# Patient Record
Sex: Male | Born: 1970 | Race: White | Hispanic: No | Marital: Single | Smoking: Current every day smoker
Health system: Southern US, Community
[De-identification: ages and names within clinical notes are randomized; demographics above are authoritative.]

## PROBLEM LIST (undated history)

## (undated) HISTORY — PX: APPENDECTOMY: SHX54

---

## 1998-09-15 ENCOUNTER — Encounter: Admission: RE | Admit: 1998-09-15 | Discharge: 1998-09-15 | Payer: Self-pay | Admitting: Family Medicine

## 2012-08-27 ENCOUNTER — Emergency Department (HOSPITAL_COMMUNITY)
Admission: EM | Admit: 2012-08-27 | Discharge: 2012-08-28 | Disposition: A | Discharge status: Home / Self Care | Payer: Self-pay | Attending: Emergency Medicine | Admitting: Emergency Medicine

## 2012-08-27 ENCOUNTER — Encounter (HOSPITAL_COMMUNITY): Payer: Self-pay | Admitting: Emergency Medicine

## 2012-08-27 DIAGNOSIS — N200 Calculus of kidney: Secondary | ICD-10-CM | POA: Insufficient documentation

## 2012-08-27 LAB — CBC WITH DIFFERENTIAL/PLATELET
Eosinophils Absolute: 0.1 10*3/uL (ref 0.0–0.7)
Hemoglobin: 14.9 g/dL (ref 13.0–17.0)
Lymphocytes Relative: 23 % (ref 12–46)
Lymphs Abs: 2.1 10*3/uL (ref 0.7–4.0)
MCH: 30.5 pg (ref 26.0–34.0)
Monocytes Relative: 6 % (ref 3–12)
Neutro Abs: 6.3 10*3/uL (ref 1.7–7.7)
Neutrophils Relative %: 70 % (ref 43–77)
RBC: 4.88 MIL/uL (ref 4.22–5.81)
WBC: 9 10*3/uL (ref 4.0–10.5)

## 2012-08-27 LAB — URINALYSIS, ROUTINE W REFLEX MICROSCOPIC
Ketones, ur: 15 mg/dL — AB
Leukocytes, UA: NEGATIVE
Nitrite: NEGATIVE
Protein, ur: 30 mg/dL — AB

## 2012-08-27 LAB — COMPREHENSIVE METABOLIC PANEL
ALT: 29 U/L (ref 0–53)
Alkaline Phosphatase: 83 U/L (ref 39–117)
BUN: 16 mg/dL (ref 6–23)
CO2: 31 mEq/L (ref 19–32)
Chloride: 102 mEq/L (ref 96–112)
GFR calc Af Amer: 70 mL/min — ABNORMAL LOW (ref >90)
Glucose, Bld: 127 mg/dL — ABNORMAL HIGH (ref 70–99)
Potassium: 3.7 mEq/L (ref 3.5–5.1)
Total Bilirubin: 0.3 mg/dL (ref 0.3–1.2)

## 2012-08-27 LAB — URINE MICROSCOPIC-ADD ON

## 2012-08-27 NOTE — ED Notes (Signed)
PT. REPORTS LLQ PAIN RADIATING TO LEFT GROIN WITH VOMITTING , DENIES DIARRHEA OR DYSURIA . NO FEVER / SLIGHT CHILLS.

## 2012-08-28 ENCOUNTER — Emergency Department (HOSPITAL_COMMUNITY): Payer: Self-pay

## 2012-08-28 MED ORDER — SODIUM CHLORIDE 0.9 % IV SOLN
Freq: Once | INTRAVENOUS | Status: AC
  Administered 2012-08-28: 01:00:00 via INTRAVENOUS

## 2012-08-28 MED ORDER — PROMETHAZINE HCL 25 MG PO TABS: 12.5000 mg | ORAL_TABLET | Freq: Four times a day (QID) | ORAL | Status: DC | PRN

## 2012-08-28 MED ORDER — HYDROMORPHONE HCL PF 1 MG/ML IJ SOLN
1.0000 mg | Freq: Once | INTRAMUSCULAR | Status: AC
  Administered 2012-08-28: 1 mg via INTRAVENOUS
  Filled 2012-08-28: qty 1

## 2012-08-28 MED ORDER — HYDROCODONE-ACETAMINOPHEN 5-500 MG PO TABS: 1.0000 | ORAL_TABLET | Freq: Four times a day (QID) | ORAL | Status: DC | PRN

## 2012-08-28 MED ORDER — ONDANSETRON HCL 4 MG/2ML IJ SOLN
4.0000 mg | Freq: Once | INTRAMUSCULAR | Status: AC
  Administered 2012-08-28: 4 mg via INTRAVENOUS
  Filled 2012-08-28: qty 2
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
## 2012-08-28 NOTE — ED Provider Notes (Signed)
Medical screening examination/treatment/procedure(s) were performed by non-physician practitioner and as supervising physician I was immediately available for consultation/collaboration.  Sunnie Nielsen, MD 08/28/12 [phone]

## 2017-02-18 ENCOUNTER — Ambulatory Visit (HOSPITAL_COMMUNITY)
Admission: EM | Admit: 2017-02-18 | Discharge: 2017-02-19 | Disposition: A | Discharge status: Home / Self Care | Payer: Self-pay | Attending: Emergency Medicine | Admitting: Emergency Medicine

## 2017-02-18 ENCOUNTER — Emergency Department (HOSPITAL_COMMUNITY): Payer: Self-pay | Admitting: Certified Registered Nurse Anesthetist

## 2017-02-18 ENCOUNTER — Encounter (HOSPITAL_COMMUNITY)
Admission: EM | Disposition: A | Discharge status: Home / Self Care | Payer: Self-pay | Source: Home / Self Care | Attending: Emergency Medicine

## 2017-02-18 ENCOUNTER — Encounter (HOSPITAL_COMMUNITY): Payer: Self-pay

## 2017-02-18 ENCOUNTER — Emergency Department (HOSPITAL_COMMUNITY): Payer: Self-pay

## 2017-02-18 DIAGNOSIS — F17210 Nicotine dependence, cigarettes, uncomplicated: Secondary | ICD-10-CM | POA: Insufficient documentation

## 2017-02-18 DIAGNOSIS — K3580 Unspecified acute appendicitis: Secondary | ICD-10-CM | POA: Diagnosis present

## 2017-02-18 DIAGNOSIS — Z87442 Personal history of urinary calculi: Secondary | ICD-10-CM | POA: Insufficient documentation

## 2017-02-18 HISTORY — PX: LAPAROSCOPIC APPENDECTOMY: SHX408

## 2017-02-18 LAB — COMPREHENSIVE METABOLIC PANEL
ALT: 50 U/L (ref 17–63)
AST: 38 U/L (ref 15–41)
Albumin: 4 g/dL (ref 3.5–5.0)
Alkaline Phosphatase: 81 U/L (ref 38–126)
Anion gap: 9 (ref 5–15)
BUN: 11 mg/dL (ref 6–20)
CHLORIDE: 100 mmol/L — AB (ref 101–111)
CO2: 26 mmol/L (ref 22–32)
CREATININE: 1.1 mg/dL (ref 0.61–1.24)
Calcium: 9.1 mg/dL (ref 8.9–10.3)
GFR calc Af Amer: >60 mL/min (ref >60)
GFR calc non Af Amer: >60 mL/min (ref >60)
GLUCOSE: 122 mg/dL — AB (ref 65–99)
Potassium: 4 mmol/L (ref 3.5–5.1)
SODIUM: 135 mmol/L (ref 135–145)
Total Bilirubin: 0.7 mg/dL (ref 0.3–1.2)
Total Protein: 6.8 g/dL (ref 6.5–8.1)

## 2017-02-18 LAB — CBC
HCT: 44.4 % (ref 39.0–52.0)
Hemoglobin: 15 g/dL (ref 13.0–17.0)
MCH: 30.7 pg (ref 26.0–34.0)
MCHC: 33.8 g/dL (ref 30.0–36.0)
MCV: 91 fL (ref 78.0–100.0)
PLATELETS: 214 10*3/uL (ref 150–400)
RBC: 4.88 MIL/uL (ref 4.22–5.81)
RDW: 12.7 % (ref 11.5–15.5)
WBC: 10.8 10*3/uL — ABNORMAL HIGH (ref 4.0–10.5)

## 2017-02-18 LAB — URINALYSIS, ROUTINE W REFLEX MICROSCOPIC
Bilirubin Urine: NEGATIVE
Glucose, UA: NEGATIVE mg/dL
Hgb urine dipstick: NEGATIVE
KETONES UR: NEGATIVE mg/dL
Leukocytes, UA: NEGATIVE
Nitrite: NEGATIVE
PH: 8 (ref 5.0–8.0)
Protein, ur: 100 mg/dL — AB
Specific Gravity, Urine: 1.023 (ref 1.005–1.030)

## 2017-02-18 LAB — LIPASE, BLOOD: LIPASE: 17 U/L (ref 11–51)

## 2017-02-18 SURGERY — APPENDECTOMY, LAPAROSCOPIC
Anesthesia: General | Site: Abdomen

## 2017-02-18 MED ORDER — ONDANSETRON HCL 4 MG/2ML IJ SOLN: 4.0000 mg | Freq: Four times a day (QID) | INTRAMUSCULAR | Status: DC | PRN

## 2017-02-18 MED ORDER — ENOXAPARIN SODIUM 40 MG/0.4ML ~~LOC~~ SOLN
40.0000 mg | SUBCUTANEOUS | Status: DC
  Administered 2017-02-19: 40 mg via SUBCUTANEOUS
  Filled 2017-02-18: qty 0.4

## 2017-02-18 MED ORDER — METHOCARBAMOL 500 MG PO TABS
500.0000 mg | ORAL_TABLET | Freq: Four times a day (QID) | ORAL | Status: DC | PRN
  Administered 2017-02-19 (×2): 500 mg via ORAL
  Filled 2017-02-18 (×3): qty 1

## 2017-02-18 MED ORDER — BUPIVACAINE-EPINEPHRINE 0.25% -1:200000 IJ SOLN
INTRAMUSCULAR | Status: DC | PRN
  Administered 2017-02-18: 10 mL

## 2017-02-18 MED ORDER — SUGAMMADEX SODIUM 200 MG/2ML IV SOLN
INTRAVENOUS | Status: DC | PRN
  Administered 2017-02-18: 160 mg via INTRAVENOUS

## 2017-02-18 MED ORDER — ONDANSETRON 4 MG PO TBDP: 4.0000 mg | ORAL_TABLET | Freq: Four times a day (QID) | ORAL | Status: DC | PRN

## 2017-02-18 MED ORDER — ROCURONIUM BROMIDE 100 MG/10ML IV SOLN
INTRAVENOUS | Status: DC | PRN
  Administered 2017-02-18: 35 mg via INTRAVENOUS

## 2017-02-18 MED ORDER — KCL IN DEXTROSE-NACL 20-5-0.9 MEQ/L-%-% IV SOLN
INTRAVENOUS | Status: DC
  Administered 2017-02-18: 100 mL/h via INTRAVENOUS
  Administered 2017-02-18 – 2017-02-19 (×2): via INTRAVENOUS
  Filled 2017-02-18 (×3): qty 1000

## 2017-02-18 MED ORDER — ARTIFICIAL TEARS OP OINT
TOPICAL_OINTMENT | OPHTHALMIC | Status: DC | PRN
  Administered 2017-02-18: 1 via OPHTHALMIC

## 2017-02-18 MED ORDER — HYDROMORPHONE HCL 1 MG/ML IJ SOLN
0.5000 mg | Freq: Once | INTRAMUSCULAR | Status: AC
  Administered 2017-02-18: 0.5 mg via INTRAVENOUS
  Filled 2017-02-18: qty 1

## 2017-02-18 MED ORDER — FENTANYL CITRATE (PF) 100 MCG/2ML IJ SOLN
INTRAMUSCULAR | Status: AC
Start: 2017-02-18 — End: 2017-02-18
  Filled 2017-02-18: qty 4

## 2017-02-18 MED ORDER — PROPOFOL 10 MG/ML IV BOLUS
INTRAVENOUS | Status: AC
  Filled 2017-02-18: qty 40

## 2017-02-18 MED ORDER — HYDROMORPHONE HCL 2 MG/ML IJ SOLN
1.0000 mg | INTRAMUSCULAR | Status: DC | PRN
  Administered 2017-02-18 – 2017-02-19 (×3): 2 mg via INTRAVENOUS
  Filled 2017-02-18 (×3): qty 1

## 2017-02-18 MED ORDER — SUCCINYLCHOLINE CHLORIDE 20 MG/ML IJ SOLN
INTRAMUSCULAR | Status: DC | PRN
  Administered 2017-02-18: 130 mg via INTRAVENOUS

## 2017-02-18 MED ORDER — IOPAMIDOL (ISOVUE-300) INJECTION 61%
100.0000 mL | Freq: Once | INTRAVENOUS | Status: AC | PRN
  Administered 2017-02-18: 100 mL via INTRAVENOUS

## 2017-02-18 MED ORDER — HYDROMORPHONE HCL 1 MG/ML IJ SOLN
0.2500 mg | INTRAMUSCULAR | Status: DC | PRN
Start: 2017-02-18 — End: 2017-02-18

## 2017-02-18 MED ORDER — METRONIDAZOLE IN NACL 5-0.79 MG/ML-% IV SOLN
500.0000 mg | Freq: Three times a day (TID) | INTRAVENOUS | Status: DC
  Administered 2017-02-18: 500 mg via INTRAVENOUS
  Filled 2017-02-18 (×2): qty 100

## 2017-02-18 MED ORDER — ONDANSETRON HCL 4 MG/2ML IJ SOLN
INTRAMUSCULAR | Status: DC | PRN
  Administered 2017-02-18: 4 mg via INTRAVENOUS

## 2017-02-18 MED ORDER — PROMETHAZINE HCL 25 MG/ML IJ SOLN: 6.2500 mg | INTRAMUSCULAR | Status: DC | PRN

## 2017-02-18 MED ORDER — BUPIVACAINE HCL (PF) 0.25 % IJ SOLN
INTRAMUSCULAR | Status: AC
  Filled 2017-02-18: qty 30

## 2017-02-18 MED ORDER — EPHEDRINE SULFATE 50 MG/ML IJ SOLN
INTRAMUSCULAR | Status: DC | PRN
  Administered 2017-02-18 (×2): 10 mg via INTRAVENOUS

## 2017-02-18 MED ORDER — HYDROMORPHONE HCL 1 MG/ML IJ SOLN
1.0000 mg | Freq: Once | INTRAMUSCULAR | Status: AC
  Administered 2017-02-18: 1 mg via INTRAVENOUS
  Filled 2017-02-18: qty 1

## 2017-02-18 MED ORDER — SUGAMMADEX SODIUM 200 MG/2ML IV SOLN
INTRAVENOUS | Status: AC
  Filled 2017-02-18: qty 2

## 2017-02-18 MED ORDER — FENTANYL CITRATE (PF) 100 MCG/2ML IJ SOLN
INTRAMUSCULAR | Status: DC | PRN
  Administered 2017-02-18: 100 ug via INTRAVENOUS
  Administered 2017-02-18: 25 ug via INTRAVENOUS

## 2017-02-18 MED ORDER — LIDOCAINE HCL (CARDIAC) 20 MG/ML IV SOLN
INTRAVENOUS | Status: DC | PRN
  Administered 2017-02-18: 100 mg via INTRAVENOUS

## 2017-02-18 MED ORDER — 0.9 % SODIUM CHLORIDE (POUR BTL) OPTIME
TOPICAL | Status: DC | PRN
  Administered 2017-02-18: 1000 mL

## 2017-02-18 MED ORDER — ACETAMINOPHEN 500 MG PO TABS
1000.0000 mg | ORAL_TABLET | Freq: Four times a day (QID) | ORAL | Status: DC
  Administered 2017-02-19 (×3): 1000 mg via ORAL
  Filled 2017-02-18 (×2): qty 2

## 2017-02-18 MED ORDER — ROCURONIUM BROMIDE 50 MG/5ML IV SOSY
PREFILLED_SYRINGE | INTRAVENOUS | Status: AC
  Filled 2017-02-18: qty 5

## 2017-02-18 MED ORDER — ONDANSETRON HCL 4 MG/2ML IJ SOLN
INTRAMUSCULAR | Status: AC
  Filled 2017-02-18: qty 2

## 2017-02-18 MED ORDER — OXYCODONE HCL 5 MG PO TABS
5.0000 mg | ORAL_TABLET | ORAL | Status: DC | PRN
  Administered 2017-02-19 (×4): 10 mg via ORAL
  Filled 2017-02-18 (×4): qty 2

## 2017-02-18 MED ORDER — SODIUM CHLORIDE 0.9 % IR SOLN
Status: DC | PRN
  Administered 2017-02-18: 1000 mL

## 2017-02-18 MED ORDER — SUCCINYLCHOLINE CHLORIDE 200 MG/10ML IV SOSY
PREFILLED_SYRINGE | INTRAVENOUS | Status: AC
  Filled 2017-02-18: qty 10

## 2017-02-18 MED ORDER — MIDAZOLAM HCL 5 MG/5ML IJ SOLN
INTRAMUSCULAR | Status: DC | PRN
  Administered 2017-02-18 (×2): 1 mg via INTRAVENOUS

## 2017-02-18 MED ORDER — LIDOCAINE 2% (20 MG/ML) 5 ML SYRINGE
INTRAMUSCULAR | Status: AC
  Filled 2017-02-18: qty 5

## 2017-02-18 MED ORDER — MIDAZOLAM HCL 2 MG/2ML IJ SOLN
INTRAMUSCULAR | Status: AC
  Filled 2017-02-18: qty 2

## 2017-02-18 MED ORDER — EPHEDRINE 5 MG/ML INJ
INTRAVENOUS | Status: AC
  Filled 2017-02-18: qty 10

## 2017-02-18 MED ORDER — DEXTROSE 5 % IV SOLN
1.0000 g | INTRAVENOUS | Status: DC
  Administered 2017-02-18: 1 g via INTRAVENOUS
  Filled 2017-02-18: qty 10

## 2017-02-18 MED ORDER — LACTATED RINGERS IV SOLN
INTRAVENOUS | Status: DC | PRN
  Administered 2017-02-18 (×2): via INTRAVENOUS

## 2017-02-18 MED ORDER — PROPOFOL 10 MG/ML IV BOLUS
INTRAVENOUS | Status: DC | PRN
  Administered 2017-02-18: 160 mg via INTRAVENOUS
  Administered 2017-02-18: 100 mg via INTRAVENOUS

## 2017-02-18 MED ORDER — ARTIFICIAL TEARS OP OINT
TOPICAL_OINTMENT | OPHTHALMIC | Status: AC
  Filled 2017-02-18: qty 3.5

## 2017-02-18 SURGICAL SUPPLY — 45 items
ADH SKN CLS APL DERMABOND .7 (GAUZE/BANDAGES/DRESSINGS) ×1
APPLIER CLIP ROT 10 11.4 M/L (STAPLE)
APR CLP MED LRG 11.4X10 (STAPLE)
BAG SPEC RTRVL LRG 6X4 10 (ENDOMECHANICALS) ×1
BLADE CLIPPER SURG (BLADE) IMPLANT
CANISTER SUCT 3000ML PPV (MISCELLANEOUS) ×3 IMPLANT
CHLORAPREP W/TINT 26ML (MISCELLANEOUS) ×3 IMPLANT
CLIP APPLIE ROT 10 11.4 M/L (STAPLE) IMPLANT
CLOSURE WOUND 1/2 X4 (GAUZE/BANDAGES/DRESSINGS) ×1
COVER SURGICAL LIGHT HANDLE (MISCELLANEOUS) ×3 IMPLANT
CUTTER FLEX LINEAR 45M (STAPLE) ×3 IMPLANT
DERMABOND ADVANCED (GAUZE/BANDAGES/DRESSINGS) ×2
DERMABOND ADVANCED .7 DNX12 (GAUZE/BANDAGES/DRESSINGS) ×1 IMPLANT
DRSG TEGADERM 2-3/8X2-3/4 SM (GAUZE/BANDAGES/DRESSINGS) ×5 IMPLANT
ELECT REM PT RETURN 9FT ADLT (ELECTROSURGICAL) ×3
ELECTRODE REM PT RTRN 9FT ADLT (ELECTROSURGICAL) ×1 IMPLANT
ENDOLOOP SUT PDS II  0 18 (SUTURE)
ENDOLOOP SUT PDS II 0 18 (SUTURE) IMPLANT
GLOVE BIOGEL PI IND STRL 8 (GLOVE) ×1 IMPLANT
GLOVE BIOGEL PI INDICATOR 8 (GLOVE) ×2
GLOVE ECLIPSE 7.5 STRL STRAW (GLOVE) ×3 IMPLANT
GOWN STRL REUS W/ TWL LRG LVL3 (GOWN DISPOSABLE) ×3 IMPLANT
GOWN STRL REUS W/TWL LRG LVL3 (GOWN DISPOSABLE) ×9
KIT BASIN OR (CUSTOM PROCEDURE TRAY) ×3 IMPLANT
KIT ROOM TURNOVER OR (KITS) ×3 IMPLANT
NS IRRIG 1000ML POUR BTL (IV SOLUTION) ×3 IMPLANT
PAD ARMBOARD 7.5X6 YLW CONV (MISCELLANEOUS) ×6 IMPLANT
POUCH SPECIMEN RETRIEVAL 10MM (ENDOMECHANICALS) ×3 IMPLANT
RELOAD 45 VASCULAR/THIN (ENDOMECHANICALS) IMPLANT
RELOAD STAPLE 45 2.5 WHT GRN (ENDOMECHANICALS) IMPLANT
RELOAD STAPLE 45 3.5 BLU ETS (ENDOMECHANICALS) IMPLANT
RELOAD STAPLE TA45 3.5 REG BLU (ENDOMECHANICALS) ×3 IMPLANT
SET IRRIG TUBING LAPAROSCOPIC (IRRIGATION / IRRIGATOR) ×3 IMPLANT
SHEARS HARMONIC ACE PLUS 36CM (ENDOMECHANICALS) ×3 IMPLANT
SLEEVE ENDOPATH XCEL 5M (ENDOMECHANICALS) ×3 IMPLANT
SPECIMEN JAR SMALL (MISCELLANEOUS) ×3 IMPLANT
STRIP CLOSURE SKIN 1/2X4 (GAUZE/BANDAGES/DRESSINGS) ×2 IMPLANT
SUT MNCRL AB 4-0 PS2 18 (SUTURE) ×3 IMPLANT
TOWEL OR 17X24 6PK STRL BLUE (TOWEL DISPOSABLE) ×3 IMPLANT
TOWEL OR 17X26 10 PK STRL BLUE (TOWEL DISPOSABLE) ×3 IMPLANT
TRAY FOLEY CATH 16FR SILVER (SET/KITS/TRAYS/PACK) ×3 IMPLANT
TRAY LAPAROSCOPIC MC (CUSTOM PROCEDURE TRAY) ×3 IMPLANT
TROCAR XCEL BLUNT TIP 100MML (ENDOMECHANICALS) ×3 IMPLANT
TROCAR XCEL NON-BLD 5MMX100MML (ENDOMECHANICALS) ×3 IMPLANT
TUBING INSUFFLATION (TUBING) ×3 IMPLANT

## 2017-02-18 NOTE — ED Notes (Signed)
ED Provider at bedside. 

## 2017-02-18 NOTE — ED Notes (Signed)
Patient transported to CT 

## 2017-02-18 NOTE — ED Provider Notes (Signed)
[facility]-EMERGENCY DEPT Provider Note   CSN: [id number] Arrival date & time: 02/18/17  0134     History   Chief Complaint Chief Complaint  Patient presents with  . Abdominal Pain    HPI Gary Ward is a 46 y.o. male who presents with RLQ abdominal pain that began at 1200 yesterday. He describes pain as "sharp sting" and rates it at a 6/10. His pain is constant but states he intermittently gets very sharp pains that "feels like an ice pick is hitting it." His pain does not radiate. He reports that pain is worsened with movement. He denies any alleviating factors. He has not taken any medication for pain but states that his pain as improved on ED arrival. He denies eating any unusual foods. He reports associated nausea/vomiting. He reports emesis is NBNB and appears like the food he was eating. His last bowel movement was yesterday at 1500 and was normal. He has a history of kidney stones but states that this does not feel similar to past episodes. He denies any recent illness, fever, CP, SOB. Patient currently smokes a pack of cigarettes a day. He denies any alcohol use.   The history is provided by the patient.    History reviewed. No pertinent past medical history.  There are no active problems to display for this patient.   History reviewed. No pertinent surgical history.     Home Medications    Prior to Admission medications   Medication Sig Start Date End Date Taking? Authorizing Provider  HYDROcodone-acetaminophen (VICODIN) 5-500 MG per tablet Take 1-2 tablets by mouth every 6 (six) hours as needed for pain. 08/28/12   Earley FavorGail Schulz, NP  promethazine (PHENERGAN) 25 MG tablet Take 0.5 tablets (12.5 mg total) by mouth every 6 (six) hours as needed for nausea. 08/28/12   Earley FavorGail Schulz, NP    Family History No family history on file.  Social History Social History  Substance Use Topics  . Smoking status: Current Every Day Smoker  . Smokeless tobacco: Never Used  . Alcohol  use Yes     Allergies   Patient has no known allergies.   Review of Systems Review of Systems  Constitutional: Positive for appetite change. Negative for chills and fever.  HENT: Positive for congestion.   Respiratory: Positive for cough. Negative for shortness of breath.   Cardiovascular: Negative for chest pain and leg swelling.  Gastrointestinal: Positive for abdominal pain, nausea and vomiting. Negative for constipation.  Genitourinary: Negative for discharge, dysuria and hematuria.  Musculoskeletal: Negative for back pain.  Neurological: Negative for headaches.  All other systems reviewed and are negative.    Physical Exam Updated Vital Signs BP 122/81   Pulse 84   Temp 98.7 F (37.1 C) (Oral)   Resp 17   SpO2 100%   Physical Exam  Constitutional: He is oriented to person, place, and time. He appears well-developed and well-nourished.  HENT:  Head: Normocephalic and atraumatic.  Mouth/Throat: Oropharynx is clear and moist and mucous membranes are normal.  Eyes: Conjunctivae, EOM and lids are normal. Pupils are equal, round, and reactive to light.  Neck: Full passive range of motion without pain.  Cardiovascular: Normal rate, regular rhythm, normal heart sounds and normal pulses.  Exam reveals no gallop and no friction rub.   No murmur heard. Pulmonary/Chest: Effort normal and breath sounds normal.  Abdominal: Soft. Normal appearance. He exhibits no distension. Bowel sounds are decreased. There is tenderness in the right lower quadrant and suprapubic  area. There is tenderness at McBurney's point. There is no rigidity, no rebound, no guarding and no CVA tenderness (bilaterally).  Negative Rosving sign.   Musculoskeletal: Normal range of motion.  Neurological: He is alert and oriented to person, place, and time.  Skin: Skin is warm and dry. Capillary refill takes less than 2 seconds.  Psychiatric: He has a normal mood and affect. His speech is normal.  Nursing note  and vitals reviewed.   ED Treatments / Results  Labs (all labs ordered are listed, but only abnormal results are displayed) Labs Reviewed  COMPREHENSIVE METABOLIC PANEL - Abnormal; Notable for the following:       Result Value   Chloride 100 (*)    Glucose, Bld 122 (*)    All other components within normal limits  CBC - Abnormal; Notable for the following:    WBC 10.8 (*)    All other components within normal limits  URINALYSIS, ROUTINE W REFLEX MICROSCOPIC - Abnormal; Notable for the following:    APPearance CLOUDY (*)    Protein, ur 100 (*)    Bacteria, UA RARE (*)    Squamous Epithelial / LPF 0-5 (*)    All other components within normal limits  LIPASE, BLOOD    EKG  EKG Interpretation None       Radiology No results found.  Procedures Procedures (including critical care time)  Medications Ordered in ED Medications - No data to display   Initial Impression / Assessment and Plan / ED Course  I have reviewed the triage vital signs and the nursing notes.  Pertinent labs & imaging results that were available during my care of the patient were reviewed by me and considered in my medical decision making (see chart for details).     Consider kidney stones vs appendicitis vs gastroenteritis vs msk pain. Plan to check CMP, CBC, Lipase, UA. Offered patient analgesic and anti-emetic but declined.   Labs reviewed. UA is unremarkable for kidney stone presentation. CBC with slight leukocytosis. Given labs and physical exam, plan to scan CT abd/pelvis to rule out appendicitis.   6:55 AM: Imaging reviewed. CT abd/pelvis with mild dilation and inflammation of appendix that is suspicious for acute appendicitis. Will discuss with surgeon on call. Discussed results with patient and explained that surgery will be down to evaluate. Consult for general surgery placed. Patient given 0.5 mg dilaudid for pain relief.     Final Clinical Impressions(s) / ED Diagnoses   Final  diagnoses:  Acute appendicitis without peritonitis    New Prescriptions There are no discharge medications for this patient.    Allen Derry [place], [place] 02/18/17 2308    Linwood Dibbles, MD 02/19/17 [phone]

## 2017-02-18 NOTE — ED Notes (Signed)
MD Wyatt at the bedside.  

## 2017-02-18 NOTE — Anesthesia Preprocedure Evaluation (Signed)
Anesthesia Evaluation  Patient identified by MRN, date of birth, ID band Patient awake    Reviewed: Allergy & Precautions, NPO status , Patient's Chart, lab work & pertinent test results  Airway Mallampati: II  TM Distance: >3 FB Neck ROM: Full    Dental no notable dental hx. (+) Dental Advisory Given   Pulmonary Current Smoker,    Pulmonary exam normal        Cardiovascular negative cardio ROS Normal cardiovascular exam     Neuro/Psych negative neurological ROS  negative psych ROS   GI/Hepatic Neg liver ROS,   Endo/Other  negative endocrine ROS  Renal/GU negative Renal ROS  negative genitourinary   Musculoskeletal negative musculoskeletal ROS (+)   Abdominal   Peds negative pediatric ROS (+)  Hematology negative hematology ROS (+)   Anesthesia Other Findings   Reproductive/Obstetrics negative OB ROS                             Anesthesia Physical Anesthesia Plan  ASA: II and emergent  Anesthesia Plan: General   Post-op Pain Management:    Induction: Intravenous, Rapid sequence and Cricoid pressure planned  Airway Management Planned: Oral ETT  Additional Equipment:   Intra-op Plan:   Post-operative Plan: Extubation in OR  Informed Consent: I have reviewed the patients History and Physical, chart, labs and discussed the procedure including the risks, benefits and alternatives for the proposed anesthesia with the patient or authorized representative who has indicated his/her understanding and acceptance.   Dental advisory given  Plan Discussed with: CRNA, Anesthesiologist and Surgeon  Anesthesia Plan Comments:         Anesthesia Quick Evaluation

## 2017-02-18 NOTE — Op Note (Signed)
OPERATIVE REPORT  DATE OF OPERATION: 02/18/2017  PATIENT:  London SheerJoseph T Roker  46 y.o. male  PRE-OPERATIVE DIAGNOSIS:  Acute appendicitis  POST-OPERATIVE DIAGNOSIS:  Acute appendicitis/early without perforation  INDICATION(S) FOR OPERATION:  Abdominal pain, recurrent, with CT evidence of acute appendicitis  FINDINGS:  Mild acute appendicitis with some exudate on the appendiceal surface  PROCEDURE:  Procedure(s): APPENDECTOMY LAPAROSCOPIC  SURGEON:  Surgeon(s): Jimmye NormanJames Jenifer Struve, MD  ASSISTANT: None  ANESTHESIA:   general  COMPLICATIONS:  None  EBL: <20 ml  BLOOD ADMINISTERED: none  DRAINS: none   SPECIMEN:  Source of Specimen:  Appendix  COUNTS CORRECT:  YES  PROCEDURE DETAILS: The patient was taken to the operating room and placed on the table in the supine position. After an adequate general endotracheal anesthetic was administered, he was prepped and draped in the usual sterile manner exposing his abdomen.  A proper timeout was performed identifying the patient and the procedure to be performed. A supraumbilical incision was made and taken down to the midline fascia where we incised the fascia with the 15 blade. We bluntly dissected down to the perineal cavity then passed a pursestring suture of 0 Vicryl around the fascial opening.  Before meals Hassan cannula was passed into the perineal cavity and secured in place with the pursestring suture. We then passed 5 mm cannulas in the right upper quadrant and the left lower quadrant and placed the patient in Trendelenburg position.  The appendix was noted to be tethered to the lateral and posterior pelvic wall on right side. This is taken down with a Harmonic Scalpel. We are then able to take down the mesoappendix of the appendix and skeletonized down to the base the cecum. We then used a blue cartridge Endo GIA to come across the base of the appendix which was subsequently retrieved from the perineal cavity using an Endo Catch  bag.  Once the appendix was removed we examined the area around the cecum and found there to be minimal to no bleeding. We irrigated with a small amount of saline then aspirated all fluid and gas with the patient in the neutral position.  The pursestring suture which was at the supraumbilical site was used to close the fascia. We then removed all cannulas. We injected all sites with 0.5% Marcaine. Once this was done the skin at the super umbilical site was closed using a running subcuticular stitch of 4-0 Monocryl. Dermabond Steri-Strips and Tegaderms are used to complete our dressings.  All needle counts, sponge counts, and his main counts were correct.  PATIENT DISPOSITION:  PACU - hemodynamically stable.   Yosiah Jasmin 3/17/20185:01 PM

## 2017-02-18 NOTE — Anesthesia Procedure Notes (Signed)
Procedure Name: Intubation Date/Time: 02/18/2017 4:00 PM Performed by: Edmonia CaprioAUSTON, Erico Stan M Pre-anesthesia Checklist: Emergency Drugs available, Suction available, Patient being monitored, Timeout performed and Patient identified Patient Re-evaluated:Patient Re-evaluated prior to inductionOxygen Delivery Method: Circle system utilized Preoxygenation: Pre-oxygenation with 100% oxygen Intubation Type: IV induction, Rapid sequence and Cricoid Pressure applied Laryngoscope Size: Karp and 2 Grade View: Grade II Tube type: Oral Tube size: 7.5 mm Number of attempts: 1 Airway Equipment and Method: Stylet Placement Confirmation: ETT inserted through vocal cords under direct vision,  positive ETCO2 and breath sounds checked- equal and bilateral Secured at: 22 cm Tube secured with: Tape Dental Injury: Teeth and Oropharynx as per pre-operative assessment  Comments: On scissoring mouth open after induction, bottom 4 front teeth noted to be loose.  No change after intubation.  Soft bite block placed between back left molars to prevent patient biting down on teeth on emergence.

## 2017-02-18 NOTE — Transfer of Care (Signed)
Immediate Anesthesia Transfer of Care Note  Patient: Gary Ward  Procedure(s) Performed: Procedure(s): APPENDECTOMY LAPAROSCOPIC (N/A)  Patient Location: PACU  Anesthesia Type:General  Level of Consciousness: awake, alert  and oriented  Airway & Oxygen Therapy: Patient Spontanous Breathing and Patient connected to nasal cannula oxygen  Post-op Assessment: Report given to RN, Post -op Vital signs reviewed and stable and Patient moving all extremities X 4  Post vital signs: Reviewed and stable  Last Vitals:  Vitals:   02/18/17 1500 02/18/17 1725  BP:  117/89  Pulse:  85  Resp:  16  Temp: 37.1 C     Last Pain:  Vitals:   02/18/17 1500  TempSrc: Oral  PainSc:          Complications: No apparent anesthesia complications

## 2017-02-18 NOTE — Anesthesia Postprocedure Evaluation (Addendum)
Anesthesia Post Note  Patient: Gary Ward  Procedure(s) Performed: Procedure(s) (LRB): APPENDECTOMY LAPAROSCOPIC (N/A)  Patient location during evaluation: PACU Anesthesia Type: General Level of consciousness: sedated Pain management: pain level controlled Vital Signs Assessment: post-procedure vital signs reviewed and stable Respiratory status: spontaneous breathing and respiratory function stable Cardiovascular status: stable Anesthetic complications: no       Last Vitals:  Vitals:   02/18/17 1750 02/18/17 1807  BP: 109/71 109/70  Pulse: 89 76  Resp: 17 15  Temp: 36.7 C 36.8 C    Last Pain:  Vitals:   02/18/17 1807  TempSrc: Oral  PainSc:                  Rakia Frayne DANIEL

## 2017-02-18 NOTE — ED Triage Notes (Signed)
Pt states that since 12pm today has had RLQ pain along with n/v X 3. No fevers

## 2017-02-18 NOTE — ED Provider Notes (Signed)
Received signout at the beginning of shift. The patient here with right lower quadrant abdominal pain since yesterday, decrease in appetite, nausea, vomiting. Pain is reproducible on exam with point tenderness at the right lower quadrant. White count is mildly elevated at 10.8. A in abdominal and pelvis CT scan obtained showing signs suggestive of early acute appendicitis without any complication feature. Appreciate consultation from general surgery, Dr. Lindie SpruceWyatt who agrees to see patient in the ER and will determine disposition.  BP 99/68   Pulse 66   Temp 98.7 F (37.1 C) (Oral)   Resp 17   SpO2 95%   Results for orders placed or performed during the hospital encounter of 02/18/17  Lipase, blood  Result Value Ref Range   Lipase 17 11 - 51 U/L  Comprehensive metabolic panel  Result Value Ref Range   Sodium 135 135 - 145 mmol/L   Potassium 4.0 3.5 - 5.1 mmol/L   Chloride 100 (L) 101 - 111 mmol/L   CO2 26 22 - 32 mmol/L   Glucose, Bld 122 (H) 65 - 99 mg/dL   BUN 11 6 - 20 mg/dL   Creatinine, Ser 1.611.10 0.61 - 1.24 mg/dL   Calcium 9.1 8.9 - 09.610.3 mg/dL   Total Protein 6.8 6.5 - 8.1 g/dL   Albumin 4.0 3.5 - 5.0 g/dL   AST 38 15 - 41 U/L   ALT 50 17 - 63 U/L   Alkaline Phosphatase 81 38 - 126 U/L   Total Bilirubin 0.7 0.3 - 1.2 mg/dL   GFR calc non Af Amer >60 >60 mL/min   GFR calc Af Amer >60 >60 mL/min   Anion gap 9 5 - 15  CBC  Result Value Ref Range   WBC 10.8 (H) 4.0 - 10.5 K/uL   RBC 4.88 4.22 - 5.81 MIL/uL   Hemoglobin 15.0 13.0 - 17.0 g/dL   HCT 04.544.4 40.939.0 - 81.152.0 %   MCV 91.0 78.0 - 100.0 fL   MCH 30.7 26.0 - 34.0 pg   MCHC 33.8 30.0 - 36.0 g/dL   RDW 91.412.7 78.211.5 - 95.615.5 %   Platelets 214 150 - 400 K/uL  Urinalysis, Routine w reflex microscopic  Result Value Ref Range   Color, Urine YELLOW YELLOW   APPearance CLOUDY (A) CLEAR   Specific Gravity, Urine 1.023 1.005 - 1.030   pH 8.0 5.0 - 8.0   Glucose, UA NEGATIVE NEGATIVE mg/dL   Hgb urine dipstick NEGATIVE NEGATIVE   Bilirubin Urine NEGATIVE NEGATIVE   Ketones, ur NEGATIVE NEGATIVE mg/dL   Protein, ur 213100 (A) NEGATIVE mg/dL   Nitrite NEGATIVE NEGATIVE   Leukocytes, UA NEGATIVE NEGATIVE   RBC / HPF 0-5 0 - 5 RBC/hpf   WBC, UA 6-30 0 - 5 WBC/hpf   Bacteria, UA RARE (A) NONE SEEN   Squamous Epithelial / LPF 0-5 (A) NONE SEEN   Mucous PRESENT    Ct Abdomen Pelvis W Contrast  Result Date: 02/18/2017 CLINICAL DATA:  Right-sided abdominal pain.  Nausea and vomiting. EXAM: CT ABDOMEN AND PELVIS WITH CONTRAST TECHNIQUE: Multidetector CT imaging of the abdomen and pelvis was performed using the standard protocol following bolus administration of intravenous contrast. CONTRAST:  100mL ISOVUE-300 IOPAMIDOL (ISOVUE-300) INJECTION 61% COMPARISON:  CT abdomen pelvis 08/28/2012 FINDINGS: Lower chest: No pulmonary nodules. No visible pleural or pericardial effusion. Hepatobiliary: Normal hepatic size and contours without focal liver lesion. No perihepatic ascites. No intra- or extrahepatic biliary dilatation. Normal gallbladder. Pancreas: Normal pancreatic contours and enhancement. No peripancreatic  fluid collection or pancreatic ductal dilatation. Spleen: Normal. Adrenals/Urinary Tract: Normal adrenal glands. No hydronephrosis or solid renal mass. Stomach/Bowel: No abnormal bowel dilatation. No bowel wall thickening or adjacent fat stranding to indicate acute inflammation. No abdominal fluid collection. The appendix is mildly dilated, measuring 8 mm. There is mild surrounding inflammatory change. Vascular/Lymphatic: Normal course and caliber of the major abdominal vessels. No abdominal or pelvic adenopathy. Reproductive: Unremarkable prostate and seminal vesicles. Musculoskeletal: Bone island in the left iliac wing. No lytic or blastic lesions. No spinal canal stenosis. Normal visualized extrathoracic and extraperitoneal soft tissues. Other: No contributory non-categorized findings. IMPRESSION: 1. Slightly dilated appendix with  mild surrounding inflammatory stranding may indicate early acute appendicitis. 2. No other acute abdominal or pelvic abnormality. Electronically Signed   By: Deatra Robinson M.D.   On: 02/18/2017 06:24      Fayrene Helper, PA-C 02/18/17 0945    Margarita Grizzle, MD 02/19/17 [phone]

## 2017-02-18 NOTE — H&P (Signed)
Gary Ward is an 46 y.o. male.   Chief Complaint: Abdominal pain HPI: Previous history of pain like this a couple of weeks ago which resolved without specific treatment over several hours.  This episode started about noon yesterday with nausea, vomiting and pain migrating from the midline to the RLQ.  CT supports possible acute early appendicitis.  History reviewed. No pertinent past medical history.  History reviewed. No pertinent surgical history.  No family history on file. Social History:  reports that he has been smoking.  He has never used smokeless tobacco. He reports that he drinks alcohol. He reports that he does not use drugs.  Allergies: No Known Allergies   (Not in a hospital admission)  Results for orders placed or performed during the hospital encounter of 02/18/17 (from the past 48 hour(s))  Urinalysis, Routine w reflex microscopic     Status: Abnormal   Collection Time: 02/18/17  1:44 AM  Result Value Ref Range   Color, Urine YELLOW YELLOW   APPearance CLOUDY (A) CLEAR   Specific Gravity, Urine 1.023 1.005 - 1.030   pH 8.0 5.0 - 8.0   Glucose, UA NEGATIVE NEGATIVE mg/dL   Hgb urine dipstick NEGATIVE NEGATIVE   Bilirubin Urine NEGATIVE NEGATIVE   Ketones, ur NEGATIVE NEGATIVE mg/dL   Protein, ur 100 (A) NEGATIVE mg/dL   Nitrite NEGATIVE NEGATIVE   Leukocytes, UA NEGATIVE NEGATIVE   RBC / HPF 0-5 0 - 5 RBC/hpf   WBC, UA 6-30 0 - 5 WBC/hpf   Bacteria, UA RARE (A) NONE SEEN   Squamous Epithelial / LPF 0-5 (A) NONE SEEN   Mucous PRESENT   Lipase, blood     Status: None   Collection Time: 02/18/17  1:53 AM  Result Value Ref Range   Lipase 17 11 - 51 U/L  Comprehensive metabolic panel     Status: Abnormal   Collection Time: 02/18/17  1:53 AM  Result Value Ref Range   Sodium 135 135 - 145 mmol/L   Potassium 4.0 3.5 - 5.1 mmol/L   Chloride 100 (L) 101 - 111 mmol/L   CO2 26 22 - 32 mmol/L   Glucose, Bld 122 (H) 65 - 99 mg/dL   BUN 11 6 - 20 mg/dL   Creatinine, Ser 1.10 0.61 - 1.24 mg/dL   Calcium 9.1 8.9 - 10.3 mg/dL   Total Protein 6.8 6.5 - 8.1 g/dL   Albumin 4.0 3.5 - 5.0 g/dL   AST 38 15 - 41 U/L   ALT 50 17 - 63 U/L   Alkaline Phosphatase 81 38 - 126 U/L   Total Bilirubin 0.7 0.3 - 1.2 mg/dL   GFR calc non Af Amer >60 >60 mL/min   GFR calc Af Amer >60 >60 mL/min    Comment: (NOTE) The eGFR has been calculated using the CKD EPI equation. This calculation has not been validated in all clinical situations. eGFR's persistently <60 mL/min signify possible Chronic Kidney Disease.    Anion gap 9 5 - 15  CBC     Status: Abnormal   Collection Time: 02/18/17  1:53 AM  Result Value Ref Range   WBC 10.8 (H) 4.0 - 10.5 K/uL   RBC 4.88 4.22 - 5.81 MIL/uL   Hemoglobin 15.0 13.0 - 17.0 g/dL   HCT 44.4 39.0 - 52.0 %   MCV 91.0 78.0 - 100.0 fL   MCH 30.7 26.0 - 34.0 pg   MCHC 33.8 30.0 - 36.0 g/dL   RDW 12.7 11.5 -  15.5 %   Platelets 214 150 - 400 K/uL   Ct Abdomen Pelvis W Contrast  Result Date: 02/18/2017 CLINICAL DATA:  Right-sided abdominal pain.  Nausea and vomiting. EXAM: CT ABDOMEN AND PELVIS WITH CONTRAST TECHNIQUE: Multidetector CT imaging of the abdomen and pelvis was performed using the standard protocol following bolus administration of intravenous contrast. CONTRAST:  125m ISOVUE-300 IOPAMIDOL (ISOVUE-300) INJECTION 61% COMPARISON:  CT abdomen pelvis 08/28/2012 FINDINGS: Lower chest: No pulmonary nodules. No visible pleural or pericardial effusion. Hepatobiliary: Normal hepatic size and contours without focal liver lesion. No perihepatic ascites. No intra- or extrahepatic biliary dilatation. Normal gallbladder. Pancreas: Normal pancreatic contours and enhancement. No peripancreatic fluid collection or pancreatic ductal dilatation. Spleen: Normal. Adrenals/Urinary Tract: Normal adrenal glands. No hydronephrosis or solid renal mass. Stomach/Bowel: No abnormal bowel dilatation. No bowel wall thickening or adjacent fat stranding to  indicate acute inflammation. No abdominal fluid collection. The appendix is mildly dilated, measuring 8 mm. There is mild surrounding inflammatory change. Vascular/Lymphatic: Normal course and caliber of the major abdominal vessels. No abdominal or pelvic adenopathy. Reproductive: Unremarkable prostate and seminal vesicles. Musculoskeletal: Bone island in the left iliac wing. No lytic or blastic lesions. No spinal canal stenosis. Normal visualized extrathoracic and extraperitoneal soft tissues. Other: No contributory non-categorized findings. IMPRESSION: 1. Slightly dilated appendix with mild surrounding inflammatory stranding may indicate early acute appendicitis. 2. No other acute abdominal or pelvic abnormality. Electronically Signed   By: KUlyses JarredM.D.   On: 02/18/2017 06:24    Review of Systems  Constitutional: Positive for chills (In the ED today). Negative for fever.  Cardiovascular: Negative for chest pain.  Gastrointestinal: Positive for abdominal pain. Negative for constipation and diarrhea.  Genitourinary: Negative for dysuria.    Blood pressure 110/74, pulse 74, temperature 98.7 F (37.1 C), temperature source Oral, resp. rate 17, SpO2 94 %. Physical Exam  Constitutional: He is oriented to person, place, and time. He appears well-developed and well-nourished.  HENT:  Head: Normocephalic and atraumatic.  Nose: Nose normal.  Eyes: Conjunctivae and EOM are normal. Pupils are equal, round, and reactive to light.  Neck: Normal range of motion. Neck supple.  Cardiovascular: Normal rate, regular rhythm, normal heart sounds and intact distal pulses.  Exam reveals no gallop.   No murmur heard. Respiratory: Effort normal and breath sounds normal.  GI: Soft. Bowel sounds are normal. He exhibits no distension. There is tenderness. There is rebound, guarding and tenderness at McBurney's point (Slightly lower than McBurney's). There is no CVA tenderness.  Musculoskeletal: Normal range of  motion.  Neurological: He is alert and oriented to person, place, and time. He has normal reflexes.  Skin: Skin is warm and dry.  Psychiatric: He has a normal mood and affect. His behavior is normal. Judgment and thought content normal.     Assessment/Plan Early acute appendicitis  IV Rocephin and Flagyl. Laparoscopic appendectomy  JJudeth Horn MD 02/18/2017, 9:33 AM

## 2017-02-19 ENCOUNTER — Encounter (HOSPITAL_COMMUNITY): Payer: Self-pay | Admitting: General Surgery

## 2017-02-19 MED ORDER — KETOROLAC TROMETHAMINE 30 MG/ML IJ SOLN
30.0000 mg | Freq: Once | INTRAMUSCULAR | Status: AC
  Administered 2017-02-19: 30 mg via INTRAVENOUS
  Filled 2017-02-19: qty 1

## 2017-02-19 MED ORDER — OXYCODONE HCL 5 MG PO TABS: 5.0000 mg | ORAL_TABLET | ORAL | 0 refills | Status: DC | PRN

## 2017-02-19 NOTE — Progress Notes (Signed)
Sats now 93% on room air after ambulating, doing IS.  Instructed pt to take IS home and to use it at home every 1 hour while awake x 10.  Will get pt ready for DC.

## 2017-02-19 NOTE — Discharge Instructions (Signed)

## 2017-02-19 NOTE — Progress Notes (Signed)
Pt going home accomp by mom.  DC instructions reviewed and copy given.  Rx for oxycodone given and explained.  Pt has # and understands to call tomorrow for follow up appt w/ [facility].

## 2017-02-19 NOTE — Progress Notes (Signed)
Pt rating pain at a "7".  Gave Toradol 30 mg IV per Dr. Corliss Skainssuei, will reassess.

## 2017-02-19 NOTE — Discharge Summary (Signed)
Physician Discharge Summary  Patient ID: Gary Ward MRN: [id number] DOB/AGE: 01/29/1971 45 y.o.  Admit date: 02/18/2017 Discharge date: 02/19/2017  Admission Diagnoses:  Acute appendicitis  Discharge Diagnoses: same Active Problems:   Acute appendicitis   Discharged Condition: good  Hospital Course: Laparoscopic appendectomy on 02/18/17 by Dr. Lindie SpruceWyatt.  Diet advanced.  Pain controlled with PO Oxycodone  Consults: None  Significant Diagnostic Studies: CT Scan. Ct Abdomen Pelvis W Contrast  Result Date: 02/18/2017 CLINICAL DATA:  Right-sided abdominal pain.  Nausea and vomiting. EXAM: CT ABDOMEN AND PELVIS WITH CONTRAST TECHNIQUE: Multidetector CT imaging of the abdomen and pelvis was performed using the standard protocol following bolus administration of intravenous contrast. CONTRAST:  100mL ISOVUE-300 IOPAMIDOL (ISOVUE-300) INJECTION 61% COMPARISON:  CT abdomen pelvis 08/28/2012 FINDINGS: Lower chest: No pulmonary nodules. No visible pleural or pericardial effusion. Hepatobiliary: Normal hepatic size and contours without focal liver lesion. No perihepatic ascites. No intra- or extrahepatic biliary dilatation. Normal gallbladder. Pancreas: Normal pancreatic contours and enhancement. No peripancreatic fluid collection or pancreatic ductal dilatation. Spleen: Normal. Adrenals/Urinary Tract: Normal adrenal glands. No hydronephrosis or solid renal mass. Stomach/Bowel: No abnormal bowel dilatation. No bowel wall thickening or adjacent fat stranding to indicate acute inflammation. No abdominal fluid collection. The appendix is mildly dilated, measuring 8 mm. There is mild surrounding inflammatory change. Vascular/Lymphatic: Normal course and caliber of the major abdominal vessels. No abdominal or pelvic adenopathy. Reproductive: Unremarkable prostate and seminal vesicles. Musculoskeletal: Bone island in the left iliac wing. No lytic or blastic lesions. No spinal canal stenosis. Normal visualized  extrathoracic and extraperitoneal soft tissues. Other: No contributory non-categorized findings. IMPRESSION: 1. Slightly dilated appendix with mild surrounding inflammatory stranding may indicate early acute appendicitis. 2. No other acute abdominal or pelvic abnormality. Electronically Signed   By: Deatra RobinsonKevin  Herman M.D.   On: 02/18/2017 06:24     Treatments: Laparoscopic appendectomy  Discharge Exam: Blood pressure 113/62, pulse 74, temperature 99.6 F (37.6 C), temperature source Oral, resp. rate 18, SpO2 96 %. General appearance: alert, cooperative and no distress GI: soft, incisional tenderness; improved RLQ tenderness Incisions c/d/i  Disposition: 01-Home or Self Care  Discharge Instructions    Call MD for:  persistant nausea and vomiting    Complete by:  As directed    Call MD for:  redness, tenderness, or signs of infection (pain, swelling, redness, odor or green/yellow discharge around incision site)    Complete by:  As directed    Call MD for:  severe uncontrolled pain    Complete by:  As directed    Call MD for:  temperature >100.4    Complete by:  As directed    Diet general    Complete by:  As directed    Driving Restrictions    Complete by:  As directed    Do not drive while taking pain medications   Increase activity slowly    Complete by:  As directed    May shower / Bathe    Complete by:  As directed      Allergies as of 02/19/2017   No Known Allergies     Medication List    TAKE these medications   oxyCODONE 5 MG immediate release tablet Commonly known as:  Oxy IR/ROXICODONE Take 1-2 tablets (5-10 mg total) by mouth every 4 (four) hours as needed for moderate pain.      Follow-up Information    [facility] [place] [place] [facility]).   Specialty:  General Surgery Why:  for post-operative follow up. call to confirm appointment date/time. Contact information: [address] [place] [address] [phone]           Signed: Wynona Luna. 02/19/2017, 10:48 AM

## 2017-05-06 NOTE — Addendum Note (Signed)
Addendum  created 05/06/17 1014 by Kraven Calk, MD   Sign clinical note    

## 2017-08-03 ENCOUNTER — Emergency Department (HOSPITAL_COMMUNITY): Payer: Self-pay

## 2017-08-03 ENCOUNTER — Emergency Department (HOSPITAL_COMMUNITY)
Admission: EM | Admit: 2017-08-03 | Discharge: 2017-08-03 | Disposition: A | Discharge status: Home / Self Care | Payer: Self-pay | Attending: Emergency Medicine | Admitting: Emergency Medicine

## 2017-08-03 ENCOUNTER — Encounter (HOSPITAL_COMMUNITY): Payer: Self-pay | Admitting: Emergency Medicine

## 2017-08-03 DIAGNOSIS — F17210 Nicotine dependence, cigarettes, uncomplicated: Secondary | ICD-10-CM | POA: Insufficient documentation

## 2017-08-03 DIAGNOSIS — N201 Calculus of ureter: Secondary | ICD-10-CM | POA: Insufficient documentation

## 2017-08-03 LAB — COMPREHENSIVE METABOLIC PANEL
ALBUMIN: 4.1 g/dL (ref 3.5–5.0)
ALK PHOS: 69 U/L (ref 38–126)
ALT: 59 U/L (ref 17–63)
ANION GAP: 6 (ref 5–15)
AST: 55 U/L — AB (ref 15–41)
BUN: 14 mg/dL (ref 6–20)
CO2: 26 mmol/L (ref 22–32)
Calcium: 8.9 mg/dL (ref 8.9–10.3)
Chloride: 105 mmol/L (ref 101–111)
Creatinine, Ser: 1.11 mg/dL (ref 0.61–1.24)
GFR calc Af Amer: >60 mL/min (ref >60)
GFR calc non Af Amer: >60 mL/min (ref >60)
Glucose, Bld: 102 mg/dL — ABNORMAL HIGH (ref 65–99)
POTASSIUM: 4 mmol/L (ref 3.5–5.1)
SODIUM: 137 mmol/L (ref 135–145)
Total Bilirubin: 0.6 mg/dL (ref 0.3–1.2)
Total Protein: 7.2 g/dL (ref 6.5–8.1)

## 2017-08-03 LAB — URINALYSIS, ROUTINE W REFLEX MICROSCOPIC
Bilirubin Urine: NEGATIVE
Glucose, UA: NEGATIVE mg/dL
Ketones, ur: 5 mg/dL — AB
Nitrite: NEGATIVE
PROTEIN: 100 mg/dL — AB
Specific Gravity, Urine: 1.027 (ref 1.005–1.030)
pH: 5 (ref 5.0–8.0)

## 2017-08-03 LAB — CBC
HEMATOCRIT: 41.7 % (ref 39.0–52.0)
HEMOGLOBIN: 14.3 g/dL (ref 13.0–17.0)
MCH: 30.6 pg (ref 26.0–34.0)
MCHC: 34.3 g/dL (ref 30.0–36.0)
MCV: 89.3 fL (ref 78.0–100.0)
Platelets: 222 10*3/uL (ref 150–400)
RBC: 4.67 MIL/uL (ref 4.22–5.81)
RDW: 12.8 % (ref 11.5–15.5)
WBC: 9 10*3/uL (ref 4.0–10.5)

## 2017-08-03 LAB — LIPASE, BLOOD: Lipase: 32 U/L (ref 11–51)

## 2017-08-03 MED ORDER — MORPHINE SULFATE (PF) 4 MG/ML IV SOLN: 4.0000 mg | Freq: Once | INTRAVENOUS | Status: DC

## 2017-08-03 MED ORDER — HYDROMORPHONE HCL 1 MG/ML IJ SOLN
1.0000 mg | Freq: Once | INTRAMUSCULAR | Status: AC
  Administered 2017-08-03: 1 mg via INTRAVENOUS
  Filled 2017-08-03: qty 1

## 2017-08-03 MED ORDER — KETOROLAC TROMETHAMINE 30 MG/ML IJ SOLN
30.0000 mg | Freq: Once | INTRAMUSCULAR | Status: AC
  Administered 2017-08-03: 30 mg via INTRAVENOUS
  Filled 2017-08-03: qty 1

## 2017-08-03 MED ORDER — OXYCODONE-ACETAMINOPHEN 5-325 MG PO TABS: 1.0000 | ORAL_TABLET | ORAL | 0 refills | Status: AC | PRN

## 2017-08-03 MED ORDER — ONDANSETRON 8 MG PO TBDP: 8.0000 mg | ORAL_TABLET | Freq: Three times a day (TID) | ORAL | 0 refills | Status: AC | PRN

## 2017-08-03 MED ORDER — ONDANSETRON 4 MG PO TBDP
4.0000 mg | ORAL_TABLET | Freq: Once | ORAL | Status: AC | PRN
  Administered 2017-08-03: 4 mg via ORAL
  Filled 2017-08-03: qty 1

## 2017-08-03 MED ORDER — TAMSULOSIN HCL 0.4 MG PO CAPS: 0.4000 mg | ORAL_CAPSULE | Freq: Every day | ORAL | 0 refills | Status: AC

## 2017-08-03 MED ORDER — IBUPROFEN 600 MG PO TABS: 600.0000 mg | ORAL_TABLET | Freq: Three times a day (TID) | ORAL | 0 refills | Status: AC | PRN

## 2017-08-03 MED ORDER — ONDANSETRON HCL 4 MG/2ML IJ SOLN: 4.0000 mg | Freq: Once | INTRAMUSCULAR | Status: DC

## 2017-08-03 NOTE — ED Notes (Signed)
Bed: ZO10WA19 Expected date:  Expected time:  Means of arrival:  Comments: [place]

## 2017-08-03 NOTE — ED Provider Notes (Signed)
[facility]-EMERGENCY DEPT Provider Note   CSN: [id number] Arrival date & time: 08/03/17  0750     History   Chief Complaint Chief Complaint  Patient presents with  . Abdominal Pain  . Groin Pain    HPI Gary Ward is a 46 y.o. male.  HPI Patient is a 46 year old male presents emergency department for worsening left lower quadrant pain with radiation towards his left groin.  He states his pain began acutely today.  His had nausea and vomiting.  Denies diarrhea.  He did have similar symptoms last month week resolved on its own and was associated with a small amount of hematuria.symptoms are moderate to severe in severity.  No flank pain.  No dysuria or urinary frequency   History reviewed. No pertinent past medical history.  Patient Active Problem List   Diagnosis Date Noted  . Acute appendicitis 02/18/2017    Past Surgical History:  Procedure Laterality Date  . APPENDECTOMY    . LAPAROSCOPIC APPENDECTOMY N/A 02/18/2017   Procedure: APPENDECTOMY LAPAROSCOPIC;  Surgeon: Jimmye Norman, MD;  Location[facility];  Service: General;  Laterality: N/A;       Home Medications    Prior to Admission medications   Medication Sig Start Date End Date Taking? Authorizing Provider  ibuprofen (ADVIL,MOTRIN) 600 MG tablet Take 1 tablet (600 mg total) by mouth every 8 (eight) hours as needed. 08/03/17   Azalia Bilis, MD  ondansetron (ZOFRAN ODT) 8 MG disintegrating tablet Take 1 tablet (8 mg total) by mouth every 8 (eight) hours as needed for nausea or vomiting. 08/03/17   Azalia Bilis, MD  oxyCODONE-acetaminophen (PERCOCET/ROXICET) 5-325 MG tablet Take 1 tablet by mouth every 4 (four) hours as needed for severe pain. 08/03/17   Azalia Bilis, MD  tamsulosin (FLOMAX) 0.4 MG CAPS capsule Take 1 capsule (0.4 mg total) by mouth daily. 08/03/17   Azalia Bilis, MD    Family History No family history on file.  Social History Social History  Substance Use Topics  . Smoking status: Current Every  Day Smoker    Types: Cigarettes  . Smokeless tobacco: Never Used  . Alcohol use Yes     Allergies   Patient has no known allergies.   Review of Systems Review of Systems  All other systems reviewed and are negative.    Physical Exam Updated Vital Signs BP 135/87   Pulse 71   Temp 98 F (36.7 C) (Oral)   Resp 15   Ht 5\' 7"  (1.702 m)   Wt 74.8 kg (165 lb)   SpO2 97%   BMI 25.84 kg/m   Physical Exam  Constitutional: He is oriented to person, place, and time. He appears well-developed and well-nourished.  HENT:  Head: Normocephalic.  Eyes: EOM are normal.  Neck: Normal range of motion.  Cardiovascular: Normal rate.   Pulmonary/Chest: Effort normal.  Abdominal: He exhibits no distension. There is no tenderness.  Genitourinary:  Genitourinary Comments: No CVA tenderness  Musculoskeletal: Normal range of motion.  Neurological: He is alert and oriented to person, place, and time.  Psychiatric: He has a normal mood and affect.  Nursing note and vitals reviewed.    ED Treatments / Results  Labs (all labs ordered are listed, but only abnormal results are displayed) Labs Reviewed  COMPREHENSIVE METABOLIC PANEL - Abnormal; Notable for the following:       Result Value   Glucose, Bld 102 (*)    AST 55 (*)    All other components within normal  limits  URINALYSIS, ROUTINE W REFLEX MICROSCOPIC - Abnormal; Notable for the following:    APPearance HAZY (*)    Hgb urine dipstick LARGE (*)    Ketones, ur 5 (*)    Protein, ur 100 (*)    Leukocytes, UA TRACE (*)    Bacteria, UA RARE (*)    Squamous Epithelial / LPF 0-5 (*)    All other components within normal limits  LIPASE, BLOOD  CBC    EKG  EKG Interpretation None       Radiology Ct Renal Stone Study  Result Date: 08/03/2017 CLINICAL DATA:  46 year old male with left abdominal pain radiating to groin. EXAM: CT ABDOMEN AND PELVIS WITHOUT CONTRAST TECHNIQUE: Multidetector CT imaging of the abdomen and pelvis  was performed following the standard protocol without IV contrast. COMPARISON:  02/18/2017 FINDINGS: Lower chest: No acute abnormality. Hepatobiliary: No focal liver abnormality is seen. No gallstones, gallbladder wall thickening, or biliary dilatation. Pancreas: Unremarkable. No pancreatic ductal dilatation or surrounding inflammatory changes. Spleen: Normal in size without focal abnormality. Adrenals/Urinary Tract: Adrenal glands are unremarkable. A 4 mm stone is identified in the distal left ureter with proximal ureteral dilatation/mild hydronephrosis and moderate periureteral and perinephric fat stranding. The right kidney is unremarkable, without renal calculi, focal lesion, or hydronephrosis. Bladder is unremarkable. Stomach/Bowel: Stomach is within normal limits. Appendix is surgically absent. No evidence of bowel wall thickening, distention, or inflammatory changes. Vascular/Lymphatic: No significant vascular findings are present. No enlarged abdominal or pelvic lymph nodes. Reproductive: Prostate is unremarkable. Other: No abdominal wall hernia or abnormality. No abdominopelvic ascites. Musculoskeletal: No acute or significant osseous findings. IMPRESSION: 1. 4 mm stone in the distal left ureter with proximal ureteral dilatation/mild hydronephrosis and moderate periureteral and perinephric fat stranding. Correlate with UA for concern for superimposed infection/pyelonephritis. 2. No other acute abdominal or pelvic abnormality. Electronically Signed   By: Sande BrothersSerena  Chacko M.D.   On: 08/03/2017 10:44    Procedures Procedures (including critical care time)  Medications Ordered in ED Medications  ondansetron (ZOFRAN-ODT) disintegrating tablet 4 mg (4 mg Oral Given 08/03/17 0802)  ketorolac (TORADOL) 30 MG/ML injection 30 mg (30 mg Intravenous Given 08/03/17 0956)  HYDROmorphone (DILAUDID) injection 1 mg (1 mg Intravenous Given 08/03/17 0956)     Initial Impression / Assessment and Plan / ED Course  I  have reviewed the triage vital signs and the nursing notes.  Pertinent labs & imaging results that were available during my care of the patient were reviewed by me and considered in my medical decision making (see chart for details).     11:00 AM  Pain controlled at this time.  Urology follow-up.  Standard sternal precautions  Final Clinical Impressions(s) / ED Diagnoses   Final diagnoses:  Left ureteral stone    New Prescriptions New Prescriptions   IBUPROFEN (ADVIL,MOTRIN) 600 MG TABLET    Take 1 tablet (600 mg total) by mouth every 8 (eight) hours as needed.   ONDANSETRON (ZOFRAN ODT) 8 MG DISINTEGRATING TABLET    Take 1 tablet (8 mg total) by mouth every 8 (eight) hours as needed for nausea or vomiting.   OXYCODONE-ACETAMINOPHEN (PERCOCET/ROXICET) 5-325 MG TABLET    Take 1 tablet by mouth every 4 (four) hours as needed for severe pain.   TAMSULOSIN (FLOMAX) 0.4 MG CAPS CAPSULE    Take 1 capsule (0.4 mg total) by mouth daily.     Azalia Bilisampos, Aracelys Glade, MD 08/03/17 1101

## 2017-08-03 NOTE — ED Triage Notes (Signed)
Patient c/o left abd pain that radiates down to left groin since last night. Patient denies any urinary problems. Patient reports having this pain before months ago that went away on its on. Patient reports nausea and dry heaving this am.

## 2022-04-25 ENCOUNTER — Emergency Department (HOSPITAL_COMMUNITY)
Admission: EM | Admit: 2022-04-25 | Discharge: 2022-04-25 | Disposition: A | Discharge status: Home / Self Care | Payer: BC Managed Care – PPO | Attending: Emergency Medicine | Admitting: Emergency Medicine

## 2022-04-25 ENCOUNTER — Other Ambulatory Visit: Payer: Self-pay

## 2022-04-25 ENCOUNTER — Encounter (HOSPITAL_COMMUNITY): Payer: Self-pay | Admitting: Emergency Medicine

## 2022-04-25 ENCOUNTER — Emergency Department (HOSPITAL_COMMUNITY): Payer: BC Managed Care – PPO

## 2022-04-25 DIAGNOSIS — M545 Low back pain, unspecified: Secondary | ICD-10-CM | POA: Insufficient documentation

## 2022-04-25 MED ORDER — METHOCARBAMOL 500 MG PO TABS: 500.0000 mg | ORAL_TABLET | Freq: Three times a day (TID) | ORAL | 0 refills | Status: AC | PRN

## 2022-04-25 MED ORDER — KETOROLAC TROMETHAMINE 60 MG/2ML IM SOLN
30.0000 mg | Freq: Once | INTRAMUSCULAR | Status: AC
Start: 2022-04-25 — End: 2022-04-25
  Administered 2022-04-25: 30 mg via INTRAMUSCULAR
  Filled 2022-04-25: qty 2

## 2022-04-25 MED ORDER — METHOCARBAMOL 500 MG PO TABS
500.0000 mg | ORAL_TABLET | Freq: Once | ORAL | Status: AC
  Administered 2022-04-25: 500 mg via ORAL
  Filled 2022-04-25: qty 1

## 2022-04-25 MED ORDER — LIDOCAINE 4 % EX PTCH: 1.0000 | MEDICATED_PATCH | CUTANEOUS | 0 refills | Status: AC

## 2022-04-25 MED ORDER — LIDOCAINE 5 % EX PTCH
1.0000 | MEDICATED_PATCH | CUTANEOUS | Status: DC
  Administered 2022-04-25: 1 via TRANSDERMAL
  Filled 2022-04-25: qty 1

## 2022-04-25 MED ORDER — ACETAMINOPHEN 325 MG PO TABS
650.0000 mg | ORAL_TABLET | Freq: Once | ORAL | Status: AC
  Administered 2022-04-25: 650 mg via ORAL
  Filled 2022-04-25: qty 2

## 2022-04-25 NOTE — ED Triage Notes (Signed)
Pt c/o back pain ans states " I was at work and felt a pop at work" He woke up Saturday and could barely move, pain progressively getting worse lower mid-left back.

## 2022-04-25 NOTE — ED Provider Notes (Signed)
[facility] EMERGENCY DEPARTMENT Provider Note   CSN: [id number] Arrival date & time: 04/25/22  4818     History  Chief Complaint  Patient presents with   Back Pain    Gary Ward is a 51 y.o. male.   Back Pain Patient presenting for low back pain.  He does not have any known chronic medical conditions.  For his employment, he does typically strain and lift a lot throughout the day.  On Friday, he was lifting a window.  At that point, he felt a pop in his lower back.  He did not experience any immediate pain.  The following morning, he woke with lower back pain and stiffness.  Pain does not radiate.  Pain is worsened with movement and coughing.  He has not taken anything for analgesia.  He does not have a history of low back problems.  He denies any distal numbness, weakness, or paresthesias.  He has not had any difficulty with urination.  Home Medications Prior to Admission medications   Medication Sig Start Date End Date Taking? Authorizing Provider  lidocaine 4 % Place 1 patch onto the skin daily for 5 days. 04/25/22 04/30/22 Yes Gloris Manchester, MD  methocarbamol (ROBAXIN) 500 MG tablet Take 1 tablet (500 mg total) by mouth every 8 (eight) hours as needed for muscle spasms. 04/25/22  Yes Gloris Manchester, MD  ibuprofen (ADVIL,MOTRIN) 600 MG tablet Take 1 tablet (600 mg total) by mouth every 8 (eight) hours as needed. 08/03/17   Azalia Bilis, MD  ondansetron (ZOFRAN ODT) 8 MG disintegrating tablet Take 1 tablet (8 mg total) by mouth every 8 (eight) hours as needed for nausea or vomiting. 08/03/17   Azalia Bilis, MD  oxyCODONE-acetaminophen (PERCOCET/ROXICET) 5-325 MG tablet Take 1 tablet by mouth every 4 (four) hours as needed for severe pain. 08/03/17   Azalia Bilis, MD  tamsulosin (FLOMAX) 0.4 MG CAPS capsule Take 1 capsule (0.4 mg total) by mouth daily. 08/03/17   Azalia Bilis, MD      Allergies    Patient has no known allergies.    Review of Systems   Review of Systems   Musculoskeletal:  Positive for back pain.  All other systems reviewed and are negative.  Physical Exam Updated Vital Signs BP 110/78   Pulse 62   Temp 97.8 F (36.6 C) (Oral)   Resp 18   Ht 5\' 7"  (1.702 m)   Wt 77.1 kg   SpO2 99%   BMI 26.63 kg/m  Physical Exam Vitals and nursing note reviewed.  Constitutional:      General: He is not in acute distress.    Appearance: Normal appearance. He is well-developed and normal weight. He is not toxic-appearing or diaphoretic.  HENT:     Head: Normocephalic and atraumatic.     Right Ear: External ear normal.     Left Ear: External ear normal.     Nose: Nose normal.     Mouth/Throat:     Mouth: Mucous membranes are moist.  Eyes:     General: No scleral icterus.    Extraocular Movements: Extraocular movements intact.     Conjunctiva/sclera: Conjunctivae normal.  Cardiovascular:     Rate and Rhythm: Normal rate and regular rhythm.     Heart sounds: No murmur heard. Pulmonary:     Effort: Pulmonary effort is normal. No respiratory distress.  Abdominal:     General: There is no distension.     Palpations: Abdomen is soft.  Musculoskeletal:  General: Tenderness (Mild tenderness at L2 with paraspinous tenderness on the left) present. No swelling or deformity.     Cervical back: Normal range of motion and neck supple. No rigidity.     Right lower leg: No edema.     Left lower leg: No edema.     Comments: Negative straight raise test bilaterally.  Skin:    General: Skin is warm and dry.     Capillary Refill: Capillary refill takes less than 2 seconds.     Coloration: Skin is not jaundiced or pale.  Neurological:     General: No focal deficit present.     Mental Status: He is alert and oriented to person, place, and time.     Cranial Nerves: No cranial nerve deficit.     Sensory: No sensory deficit.     Motor: No weakness.     Coordination: Coordination normal.     Comments: No lower extremity weakness or diminished  sensation.  No saddle anesthesia.  Psychiatric:        Mood and Affect: Mood normal.        Behavior: Behavior normal.        Thought Content: Thought content normal.        Judgment: Judgment normal.    ED Results / Procedures / Treatments   Labs (all labs ordered are listed, but only abnormal results are displayed) Labs Reviewed - No data to display  EKG None  Radiology DG Lumbar Spine Complete  Result Date: 04/25/2022 CLINICAL DATA:  Lower back pain EXAM: LUMBAR SPINE - COMPLETE 4+ VIEW COMPARISON:  CT 08/03/2017 FINDINGS: There are 5 non-rib-bearing lumbar vertebrae. There is no evidence of lumbar spine fracture. There is mild degenerative disc height loss at L2-L3. There is mild lower lumbar predominant facet arthropathy. Mild straightening of the lumbar lordosis. No static listhesis. Unchanged round metallic foreign body overlying the right paraspinal musculature. There are 2 mm calculi overlying the right renal shadow possibly a renal stones. IMPRESSION: No evidence of lumbar spine fracture. Mild degenerative disc height loss at L2-L3. Mild lower lumbar predominant facet arthropathy. Possible 2 mm right renal calculi. Electronically Signed   By: Caprice Renshaw M.D.   On: 04/25/2022 09:42    Procedures Procedures    Medications Ordered in ED Medications  lidocaine (LIDODERM) 5 % 1 patch (1 patch Transdermal Patch Applied 04/25/22 0943)  ketorolac (TORADOL) injection 30 mg (30 mg Intramuscular Given 04/25/22 0943)  methocarbamol (ROBAXIN) tablet 500 mg (500 mg Oral Given 04/25/22 0942)  acetaminophen (TYLENOL) tablet 650 mg (650 mg Oral Given 04/25/22 4166)    ED Course/ Medical Decision Making/ A&P                           Medical Decision Making Amount and/or Complexity of Data Reviewed Radiology: ordered.  Risk OTC drugs. Prescription drug management.   Healthy 51 year old male presenting for low back pain.  Onset was Saturday morning.  This followed an episode Friday  during which she felt a pop in his lower back while lifting a window.  Patient denies any red flag symptoms.  He is well-appearing on arrival with normal vital signs.  On physical exam, he has no evidence of lower extremity weakness, or diminished sensation.  He has some focal tenderness in the area of L2 that radiates down and to the left on his lower back.  Straight leg raise test is negative bilaterally.  Patient was given  multimodal pain control with lidocaine patch, Tylenol, Toradol, and Robaxin.  X-ray of lumbar spine showed slight narrowing between L2 and L3 with no evidence of acute fractures.  Patient did experience relief of symptoms following multimodal pain control.  He was advised to continue these therapies at home, to avoid lifting movements that may exacerbate his symptoms, and to follow-up with orthopedics/sports medicine as needed.  He was discharged in good condition.        Final Clinical Impression(s) / ED Diagnoses Final diagnoses:  Acute left-sided low back pain without sciatica    Rx / DC Orders ED Discharge Orders          Ordered    methocarbamol (ROBAXIN) 500 MG tablet  Every 8 hours PRN        04/25/22 1010    lidocaine 4 %  Every 24 hours        04/25/22 1010              Gloris Manchesterixon, Caria Transue, MD 04/25/22 [phone]

## 2022-04-25 NOTE — Discharge Instructions (Addendum)
Take ibuprofen and Tylenol over the next several days.  You can take each of these every 6 hours.  Additionally, prescriptions were sent to pharmacy for lidocaine patches and muscle relaxer.  Take the muscle relaxer as needed.  It can cause some drowsiness so take with caution.  The lidocaine patch can be left on throughout the day.  Avoid having more than 1 lidocaine patch on at any given time.  Movement is good but, if possible, avoid lifting and straining that may worsen your injury.  There is a telephone number below to call for sports medicine follow-up if you do have persistent symptoms.  If you have worsening symptoms, please return to the emergency department.

## 2023-03-14 IMAGING — DX DG LUMBAR SPINE COMPLETE 4+V
5 series · 5 of 5 positions shown · non-contrast
Comparison: CT 08/03/2017

CLINICAL DATA: Lower back pain

EXAM:
LUMBAR SPINE - COMPLETE 4+ VIEW

[l-spine ap]
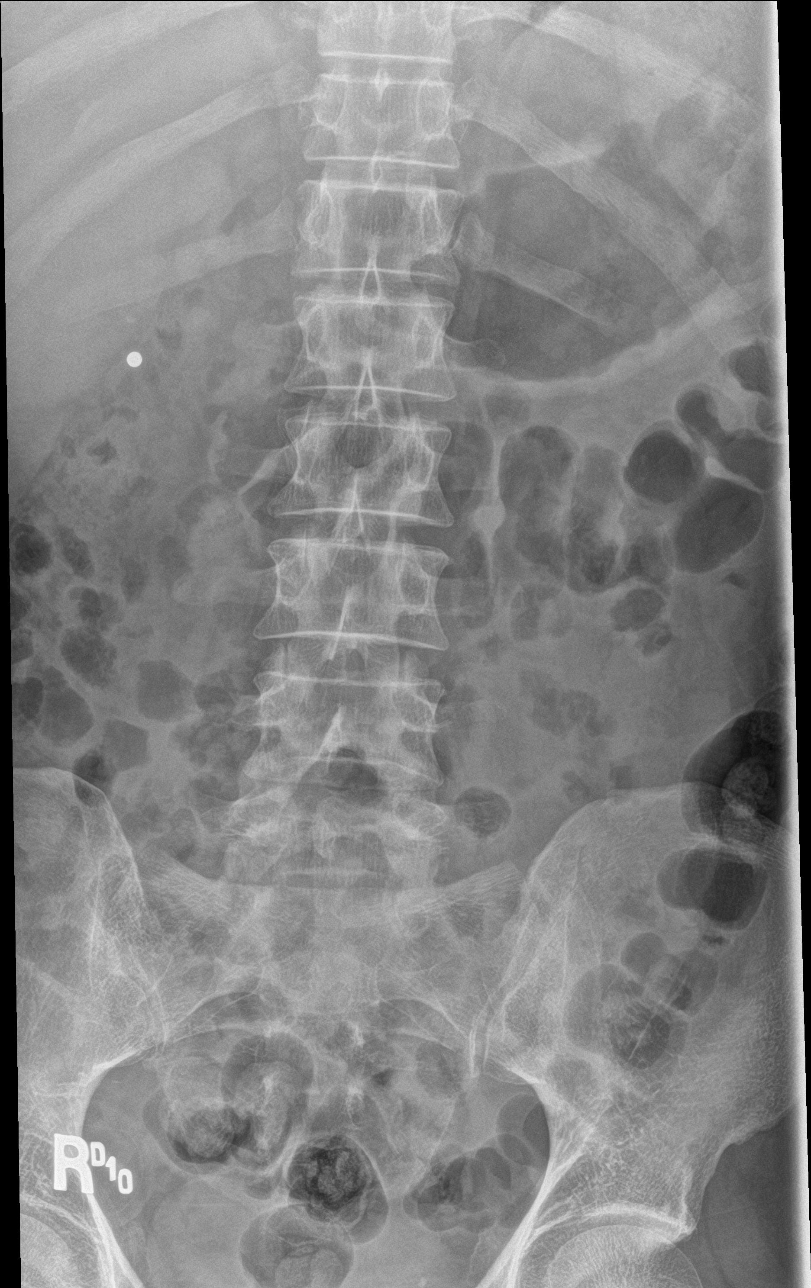

[l-spine obl (1 of 2)]
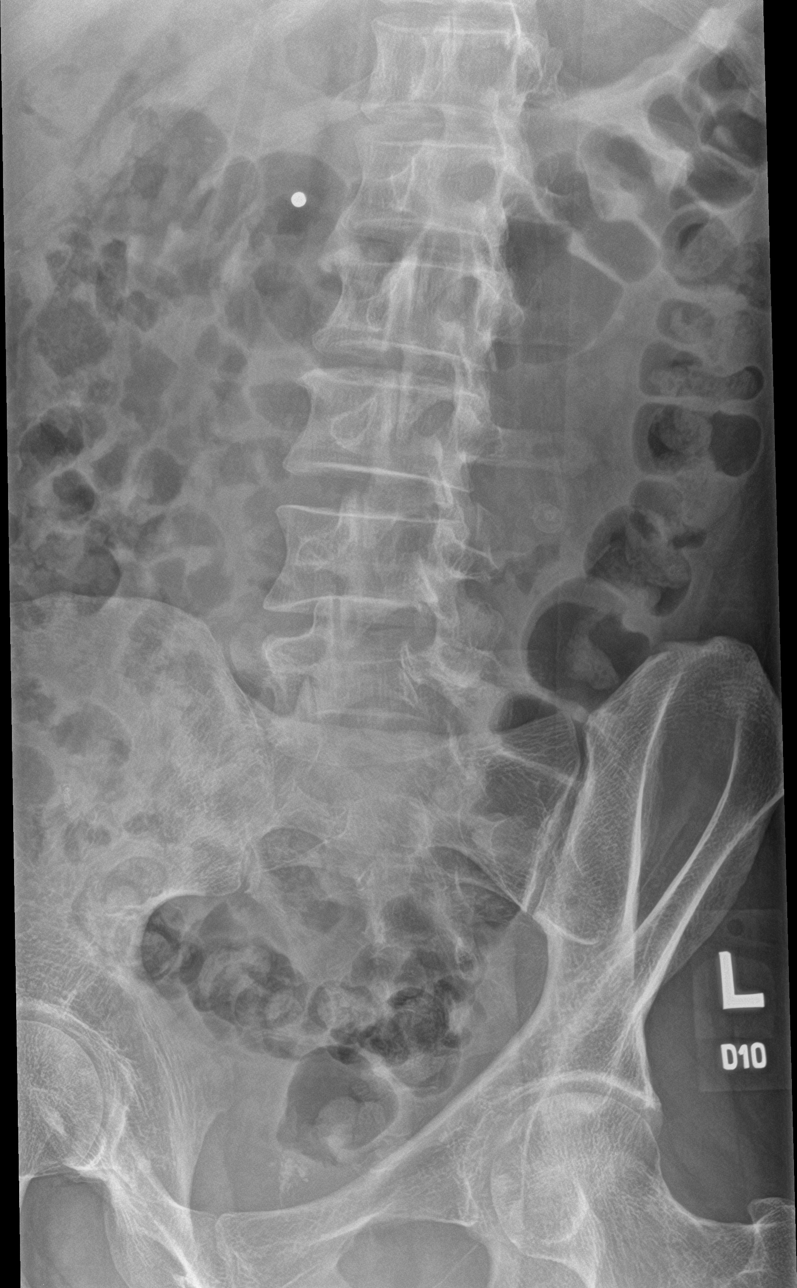

[l-spine obl (2 of 2)]
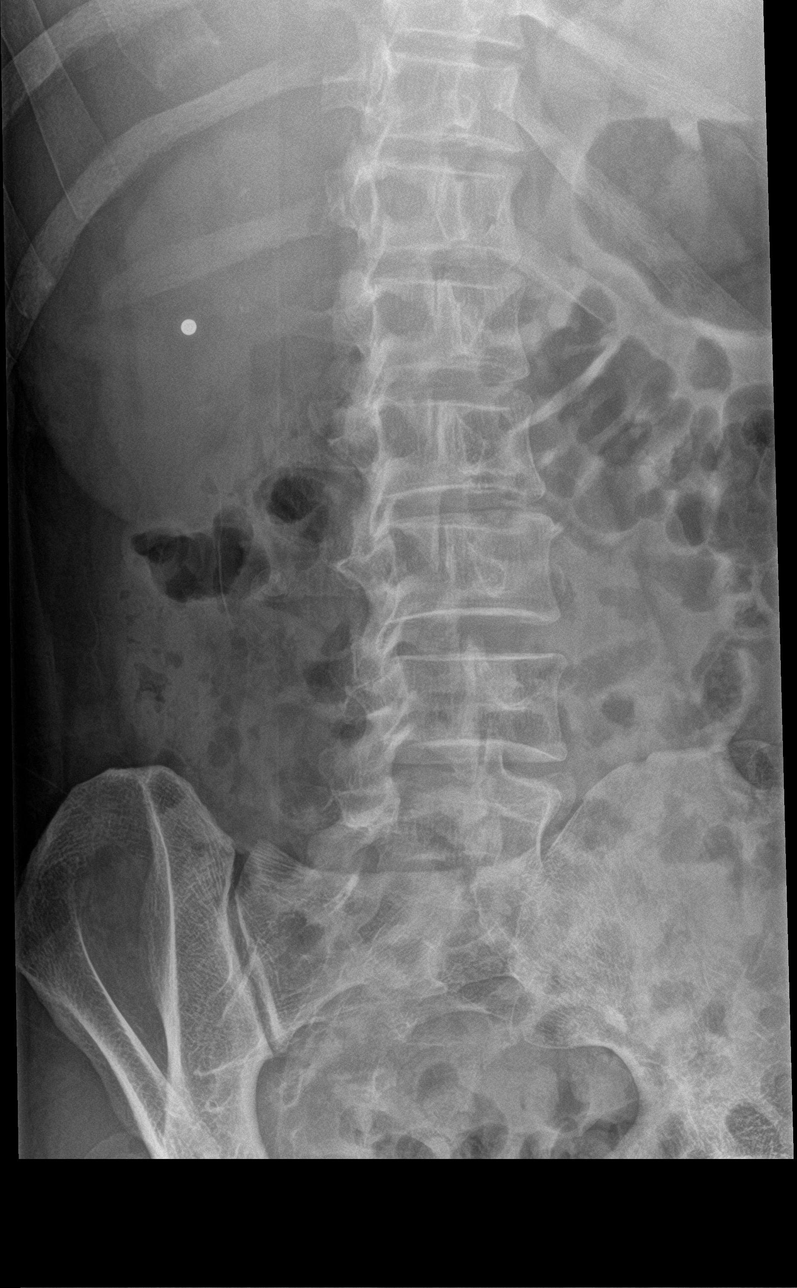

[l-spine lat]
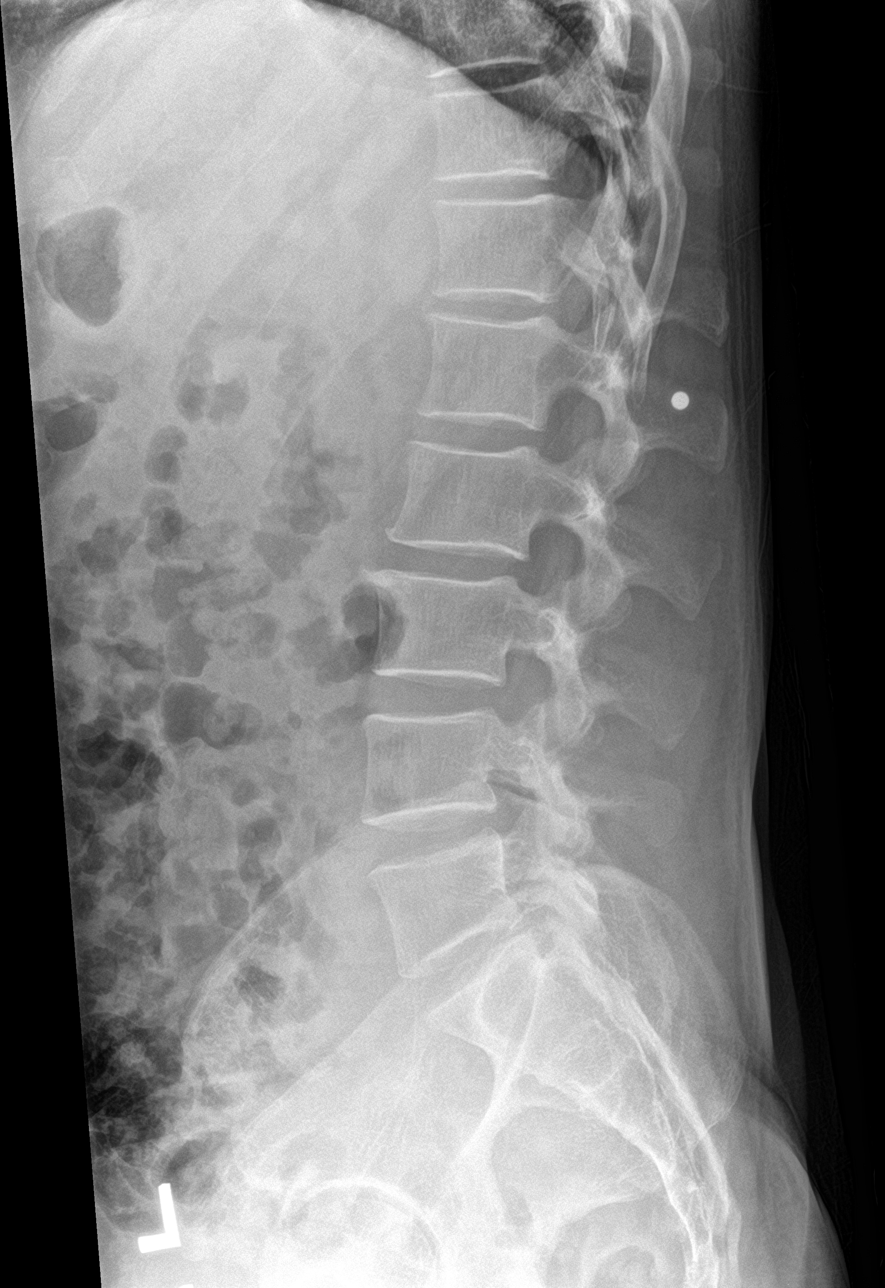

[l-spine spot]
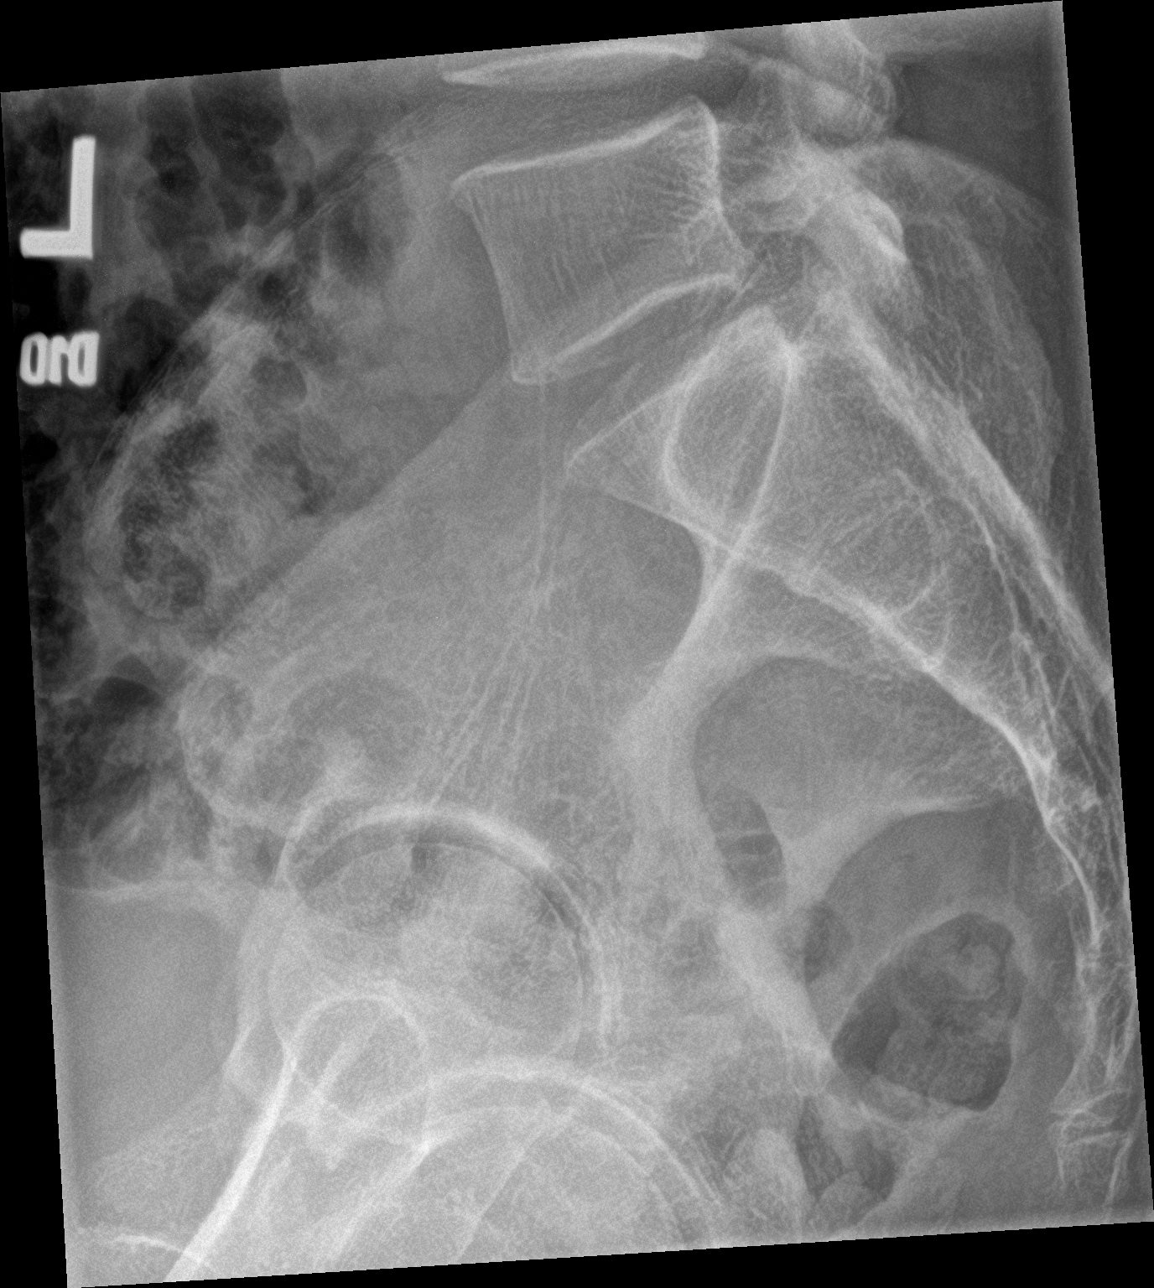

[5 of 5 positions shown; findings below may reference images not displayed]

FINDINGS: There are 5 non-rib-bearing lumbar vertebrae. There is no evidence
of lumbar spine fracture. There is mild degenerative disc height
loss at L2-L3. There is mild lower lumbar predominant facet
arthropathy. Mild straightening of the lumbar lordosis. No static
listhesis. Unchanged round metallic foreign body overlying the right
paraspinal musculature. There are 2 mm calculi overlying the right
renal shadow possibly a renal stones.
IMPRESSION: No evidence of lumbar spine fracture. Mild degenerative disc height
loss at L2-L3.

Mild lower lumbar predominant facet arthropathy.

Possible 2 mm right renal calculi.

## 2023-05-04 ENCOUNTER — Encounter (HOSPITAL_COMMUNITY): Payer: Self-pay | Admitting: *Deleted

## 2023-05-04 ENCOUNTER — Emergency Department (HOSPITAL_COMMUNITY): Payer: BC Managed Care – PPO

## 2023-05-04 ENCOUNTER — Emergency Department (HOSPITAL_COMMUNITY)
Admission: EM | Admit: 2023-05-04 | Discharge: 2023-05-04 | Disposition: A | Discharge status: Home / Self Care | Payer: BC Managed Care – PPO | Attending: Emergency Medicine | Admitting: Emergency Medicine

## 2023-05-04 DIAGNOSIS — Z87891 Personal history of nicotine dependence: Secondary | ICD-10-CM | POA: Insufficient documentation

## 2023-05-04 DIAGNOSIS — J181 Lobar pneumonia, unspecified organism: Secondary | ICD-10-CM | POA: Insufficient documentation

## 2023-05-04 DIAGNOSIS — J189 Pneumonia, unspecified organism: Secondary | ICD-10-CM

## 2023-05-04 DIAGNOSIS — E871 Hypo-osmolality and hyponatremia: Secondary | ICD-10-CM | POA: Diagnosis not present

## 2023-05-04 DIAGNOSIS — R079 Chest pain, unspecified: Secondary | ICD-10-CM | POA: Diagnosis present

## 2023-05-04 DIAGNOSIS — R0789 Other chest pain: Secondary | ICD-10-CM

## 2023-05-04 LAB — TROPONIN I (HIGH SENSITIVITY): Troponin I (High Sensitivity): 2 ng/L (ref <18)

## 2023-05-04 LAB — BASIC METABOLIC PANEL
Anion gap: 9 (ref 5–15)
BUN: 13 mg/dL (ref 6–20)
CO2: 22 mmol/L (ref 22–32)
Calcium: 8.9 mg/dL (ref 8.9–10.3)
Chloride: 101 mmol/L (ref 98–111)
Creatinine, Ser: 0.93 mg/dL (ref 0.61–1.24)
GFR, Estimated: >60 mL/min (ref >60)
Glucose, Bld: 100 mg/dL — ABNORMAL HIGH (ref 70–99)
Potassium: 3.8 mmol/L (ref 3.5–5.1)
Sodium: 132 mmol/L — ABNORMAL LOW (ref 135–145)

## 2023-05-04 LAB — CBC
HCT: 44.3 % (ref 39.0–52.0)
Hemoglobin: 15.5 g/dL (ref 13.0–17.0)
MCH: 30.8 pg (ref 26.0–34.0)
MCHC: 35 g/dL (ref 30.0–36.0)
MCV: 88.1 fL (ref 80.0–100.0)
Platelets: 193 10*3/uL (ref 150–400)
RBC: 5.03 MIL/uL (ref 4.22–5.81)
RDW: 12.4 % (ref 11.5–15.5)
WBC: 10.5 10*3/uL (ref 4.0–10.5)
nRBC: 0 % (ref 0.0–0.2)

## 2023-05-04 MED ORDER — ASPIRIN 81 MG PO CHEW
324.0000 mg | CHEWABLE_TABLET | Freq: Once | ORAL | Status: AC
  Administered 2023-05-04: 324 mg via ORAL
  Filled 2023-05-04: qty 4

## 2023-05-04 MED ORDER — DOXYCYCLINE HYCLATE 100 MG PO CAPS: 100.0000 mg | ORAL_CAPSULE | Freq: Two times a day (BID) | ORAL | 0 refills | Status: AC

## 2023-05-04 MED ORDER — SODIUM CHLORIDE 0.9 % IV BOLUS
1000.0000 mL | Freq: Once | INTRAVENOUS | Status: AC
  Administered 2023-05-04: 1000 mL via INTRAVENOUS

## 2023-05-04 MED ORDER — LIDOCAINE VISCOUS HCL 2 % MT SOLN
15.0000 mL | Freq: Once | OROMUCOSAL | Status: AC
  Administered 2023-05-04: 15 mL via ORAL
  Filled 2023-05-04: qty 15

## 2023-05-04 MED ORDER — DOXYCYCLINE HYCLATE 100 MG PO TABS
100.0000 mg | ORAL_TABLET | Freq: Once | ORAL | Status: AC
Start: 2023-05-04 — End: 2023-05-04
  Administered 2023-05-04: 100 mg via ORAL
  Filled 2023-05-04: qty 1

## 2023-05-04 MED ORDER — ALUM & MAG HYDROXIDE-SIMETH 200-200-20 MG/5ML PO SUSP
30.0000 mL | Freq: Once | ORAL | Status: AC
  Administered 2023-05-04: 30 mL via ORAL
  Filled 2023-05-04: qty 30

## 2023-05-04 NOTE — ED Provider Notes (Signed)
Gary Ward   CSN: [id number] Arrival date & time: 05/04/23  2052     History  Chief Complaint  Patient presents with   Chest Pain    Gary Ward is a 52 y.o. male.  With a history of smoking who presents to the ED for evaluation of chest pain.  This is localized to the substernal area.  Does not radiate.  It is constant and worse with coughing.  It is not exertional.  Described as a burning pain.  Pain began 2 days ago.  He states he has had flulike symptoms for the past 2 weeks which may be contributing to his chest pain.  He was resting at the time of symptom onset.  His cough was initially productive, but has not been productive for the past week.  He reports subjective fevers and bodyaches as well.  He also endorses exertional shortness of breath and fatigue.  He denies abdominal pain, nausea, vomiting.  No history of DVT or PEs, no unilateral leg swelling, recent cancer treatments, surgeries, long distance travel, hemoptysis, estrogen use.   Chest Pain      Home Medications Prior to Admission medications   Medication Sig Start Date End Date Taking? Authorizing Provider  doxycycline (VIBRAMYCIN) 100 MG capsule Take 1 capsule (100 mg total) by mouth 2 (two) times daily for 7 days. 05/04/23 05/11/23 Yes Quanta Robertshaw, Edsel Petrin, PA-C  ibuprofen (ADVIL,MOTRIN) 600 MG tablet Take 1 tablet (600 mg total) by mouth every 8 (eight) hours as needed. 08/03/17   Azalia Bilis, MD  methocarbamol (ROBAXIN) 500 MG tablet Take 1 tablet (500 mg total) by mouth every 8 (eight) hours as needed for muscle spasms. 04/25/22   Gloris Manchester, MD  ondansetron (ZOFRAN ODT) 8 MG disintegrating tablet Take 1 tablet (8 mg total) by mouth every 8 (eight) hours as needed for nausea or vomiting. 08/03/17   Azalia Bilis, MD  oxyCODONE-acetaminophen (PERCOCET/ROXICET) 5-325 MG tablet Take 1 tablet by mouth every 4 (four) hours as needed for severe pain. 08/03/17    Azalia Bilis, MD  tamsulosin (FLOMAX) 0.4 MG CAPS capsule Take 1 capsule (0.4 mg total) by mouth daily. 08/03/17   Azalia Bilis, MD      Allergies    Patient has no known allergies.    Review of Systems   Review of Systems  Cardiovascular:  Positive for chest pain.  All other systems reviewed and are negative.   Physical Exam Updated Vital Signs BP 110/72   Pulse 81   Temp 98.5 F (36.9 C) (Oral)   Resp 15   Ht 5\' 7"  (1.702 m)   SpO2 97%   BMI 26.63 kg/m  Physical Exam Vitals and nursing Ward reviewed.  Constitutional:      General: He is not in acute distress.    Appearance: He is well-developed. He is not ill-appearing, toxic-appearing or diaphoretic.     Comments: Resting comfortably in bed  HENT:     Head: Normocephalic and atraumatic.  Eyes:     Conjunctiva/sclera: Conjunctivae normal.  Neck:     Vascular: No JVD.  Cardiovascular:     Rate and Rhythm: Normal rate and regular rhythm.     Heart sounds: No murmur heard. Pulmonary:     Effort: Pulmonary effort is normal. No respiratory distress.     Breath sounds: Normal breath sounds. No decreased breath sounds, wheezing, rhonchi or rales.  Chest:     Chest wall: No  tenderness.     Comments: No rashes Abdominal:     Palpations: Abdomen is soft.     Tenderness: There is no abdominal tenderness.  Musculoskeletal:        General: No swelling.     Cervical back: Neck supple.     Right lower leg: No edema.     Left lower leg: No edema.  Skin:    General: Skin is warm and dry.     Capillary Refill: Capillary refill takes less than 2 seconds.  Neurological:     General: No focal deficit present.     Mental Status: He is alert and oriented to person, place, and time.  Psychiatric:        Mood and Affect: Mood normal.        Behavior: Behavior normal.     ED Results / Procedures / Treatments   Labs (all labs ordered are listed, but only abnormal results are displayed) Labs Reviewed  BASIC METABOLIC PANEL  - Abnormal; Notable for the following components:      Result Value   Sodium 132 (*)    Glucose, Bld 100 (*)    All other components within normal limits  CBC  TROPONIN I (HIGH SENSITIVITY)  TROPONIN I (HIGH SENSITIVITY)    EKG EKG Interpretation  Date/Time:  Thursday May 04 2023 21:00:42 EDT Ventricular Rate:  91 PR Interval:  144 QRS Duration: 80 QT Interval:  356 QTC Calculation: 437 R Axis:   68 Text Interpretation: Normal sinus rhythm Anterior infarct , age undetermined Abnormal ECG No previous ECGs available Confirmed by Jacalyn Lefevre [phone]) on 05/04/2023 9:24:28 PM  Radiology DG Chest 2 View  Result Date: 05/04/2023 CLINICAL DATA:  Shortness of breath, chest pain EXAM: CHEST - 2 VIEW COMPARISON:  None Available. FINDINGS: Ill-defined density at the left lung base which appears anterior on the lateral view, likely within the lingula. Right lung clear. Heart and mediastinal contours within normal limits. No effusions or acute bony abnormality. IMPRESSION: Ill-defined lingular opacity may reflect early pneumonia. Electronically Signed   By: Charlett Nose M.D.   On: 05/04/2023 21:26    Procedures Procedures    Medications Ordered in ED Medications  aspirin chewable tablet 324 mg (324 mg Oral Given 05/04/23 2122)  alum & mag hydroxide-simeth (MAALOX/MYLANTA) 200-200-20 MG/5ML suspension 30 mL (30 mLs Oral Given 05/04/23 2122)    And  lidocaine (XYLOCAINE) 2 % viscous mouth solution 15 mL (15 mLs Oral Given 05/04/23 2122)  sodium chloride 0.9 % bolus 1,000 mL (0 mLs Intravenous Stopped 05/04/23 2230)  doxycycline (VIBRA-TABS) tablet 100 mg (100 mg Oral Given 05/04/23 2235)    ED Course/ Medical Decision Making/ A&P             HEART Score: 3                Medical Decision Making Amount and/or Complexity of Data Reviewed Labs: ordered. Radiology: ordered.  Risk OTC drugs. Prescription drug management.  This patient presents to the ED for concern of chest pain, this  involves an extensive number of treatment options, and is a complaint that carries with it a high risk of complications and morbidity. The emergent differential diagnosis of chest pain includes: Acute coronary syndrome, pericarditis, aortic dissection, pulmonary embolism, tension pneumothorax, and esophageal rupture.  I do not believe the patient has an emergent cause of chest pain, other urgent/non-acute considerations include, but are not limited to: chronic angina, aortic stenosis, cardiomyopathy, myocarditis, mitral valve  prolapse, pulmonary hypertension, hypertrophic obstructive cardiomyopathy (HOCM), aortic insufficiency, right ventricular hypertrophy, pneumonia, pleuritis, bronchitis, pneumothorax, tumor, gastroesophageal reflux disease (GERD), esophageal spasm, Mallory-Weiss syndrome, peptic ulcer disease, biliary disease, pancreatitis, functional gastrointestinal pain, cervical or thoracic disk disease or arthritis, shoulder arthritis, costochondritis, subacromial bursitis, anxiety or panic attack, herpes zoster, breast disorders, chest wall tumors, thoracic outlet syndrome, mediastinitis.  Co morbidities that complicate the patient evaluation  smoking  My initial workup includes ACS rule out, GI cocktail  Additional history obtained from: Nursing notes from this visit.  I ordered, reviewed and interpreted labs which include: CBC, BMP, troponin. Hyponatremia of 132. Labs otherwise reassuring.  I ordered imaging studies including chest x-ray I independently visualized and interpreted imaging which showed possible early pneumonia of the left lingula I agree with the radiologist interpretation  Cardiac Monitoring:  The patient was maintained on a cardiac monitor.  I personally viewed and interpreted the cardiac monitored which showed an underlying rhythm of: NSR  Afebrile, hemodynamically stable.  52 year old male presents to the ED for evaluation of substernal chest pain and flulike  illness.  He has been sick for the past 2 weeks with a flu like illness.  On exam he is resting comfortably in bed.  He is nontachypneic and nonhypoxic.  He is nontoxic-appearing.  His lab workup was overall reassuring.  He had some mild hyponatremia which was treated in the ED.  Chest x-ray concerning for pneumonia.  Believe this is likely the cause of his chest pain.  Troponin is negative.  EKG without acute ischemic changes.  Heart score of 3 and I have low suspicion for ACS as the cause of his symptoms. Wells score of 0 and I have low suspicion for PE as the cause. He did voice concern about financial issues while in the ED in regards to his antibiotic prescription. Will give him a Biomedical engineer for financial assistance. Curb-65 score of 0. He was given strict return precautions. He was strongly encouraged to establish care with a PCP of his choice within the next 5 days for reevaluation. He was encouraged to stop smoking. Stable at discharge.  At this time there does not appear to be any evidence of an acute emergency medical condition and the patient appears stable for discharge with appropriate outpatient follow up. Diagnosis was discussed with patient who verbalizes understanding of care plan and is agreeable to discharge. I have discussed return precautions with patient who verbalizes understanding. Patient encouraged to follow-up with their PCP within 1 week. All questions answered.  Patient's case discussed with Dr. Particia Nearing who agrees with plan to discharge with follow-up.   Ward: Portions of this report may have been transcribed using voice recognition software. Every effort was made to ensure accuracy; however, inadvertent computerized transcription errors may still be present.        Final Clinical Impression(s) / ED Diagnoses Final diagnoses:  Community acquired pneumonia of left upper lobe of lung  Atypical chest pain    Rx / DC Orders ED Discharge Orders           Ordered    doxycycline (VIBRAMYCIN) 100 MG capsule  2 times daily        05/04/23 2241              Mora Bellman 05/04/23 2247    Jacalyn Lefevre, MD 05/04/23 [phone]

## 2023-05-04 NOTE — ED Notes (Signed)
Patient transported to X-ray 

## 2023-05-04 NOTE — ED Triage Notes (Signed)
Pt c/o flu like symptoms and non productive cough x 2 wks, pt reports symptoms not improving, pt report non radiating CP was with coughing now is constant, pt c/o SOB worse with exertion, A&O x4

## 2023-05-04 NOTE — Discharge Instructions (Addendum)
You have been seen today for your complaint of chest pain, shortness of breath, fatigue. Your lab work was reassuring. Your imaging showed possible pneumonia. Your discharge medications include doxycycline. This is an antibiotic. You should take it as prescribed. You should take it for the entire duration of the prescription. This may cause an upset stomach. This is normal. You may take this with food. You may also eat yogurt to prevent diarrhea. Follow up with: a primary care provider of your choice within the next 5 days for reevaluation. Please seek immediate medical care if you develop any of the following symptoms: You are short of breath and this gets worse. You have more chest pain. Your sickness gets worse. This is very serious if: You are an older adult. Your body's defense system is weak. You cough up blood. At this time there does not appear to be the presence of an emergent medical condition, however there is always the potential for conditions to change. Please read and follow the below instructions.  Do not take your medicine if  develop an itchy rash, swelling in your mouth or lips, or difficulty breathing; call 911 and seek immediate emergency medical attention if this occurs.  You may review your lab tests and imaging results in their entirety on your MyChart account.  Please discuss all results of fully with your primary care provider and other specialist at your follow-up visit.  Note: Portions of this text may have been transcribed using voice recognition software. Every effort was made to ensure accuracy; however, inadvertent computerized transcription errors may still be present.
